# Patient Record
Sex: Female | Born: 1975 | Race: White | Hispanic: No | Marital: Married | Smoking: Never smoker
Health system: Southern US, Community
[De-identification: ages and names within clinical notes are randomized; demographics above are authoritative.]

## PROBLEM LIST (undated history)

## (undated) DIAGNOSIS — K200 Eosinophilic esophagitis: Secondary | ICD-10-CM

## (undated) DIAGNOSIS — K5792 Diverticulitis of intestine, part unspecified, without perforation or abscess without bleeding: Secondary | ICD-10-CM

## (undated) DIAGNOSIS — K259 Gastric ulcer, unspecified as acute or chronic, without hemorrhage or perforation: Secondary | ICD-10-CM

## (undated) DIAGNOSIS — E785 Hyperlipidemia, unspecified: Secondary | ICD-10-CM

## (undated) DIAGNOSIS — K219 Gastro-esophageal reflux disease without esophagitis: Secondary | ICD-10-CM

## (undated) DIAGNOSIS — B9689 Other specified bacterial agents as the cause of diseases classified elsewhere: Secondary | ICD-10-CM

## (undated) DIAGNOSIS — J208 Acute bronchitis due to other specified organisms: Secondary | ICD-10-CM

## (undated) DIAGNOSIS — R1310 Dysphagia, unspecified: Secondary | ICD-10-CM

## (undated) HISTORY — DX: Eosinophilic esophagitis: K20.0

## (undated) HISTORY — DX: Gastro-esophageal reflux disease without esophagitis: K21.9

## (undated) HISTORY — DX: Diverticulitis of intestine, part unspecified, without perforation or abscess without bleeding: K57.92

## (undated) HISTORY — DX: Dysphagia, unspecified: R13.10

## (undated) HISTORY — DX: Gastric ulcer, unspecified as acute or chronic, without hemorrhage or perforation: K25.9

## (undated) HISTORY — DX: Other specified bacterial agents as the cause of diseases classified elsewhere: B96.89

## (undated) HISTORY — PX: UPPER GASTROINTESTINAL ENDOSCOPY: SHX188

## (undated) HISTORY — PX: TONSILLECTOMY: SUR1361

## (undated) HISTORY — DX: Hyperlipidemia, unspecified: E78.5

## (undated) HISTORY — PX: CHOLECYSTECTOMY: SHX55

## (undated) HISTORY — DX: Other specified bacterial agents as the cause of diseases classified elsewhere: J20.8

---

## 2014-08-21 HISTORY — PX: COLONOSCOPY: SHX174

## 2014-10-23 HISTORY — PX: COLON RESECTION: SHX5231

## 2017-05-26 ENCOUNTER — Encounter: Payer: Self-pay | Admitting: Gastroenterology

## 2017-05-26 HISTORY — PX: ESOPHAGOGASTRODUODENOSCOPY: SHX1529

## 2017-06-16 ENCOUNTER — Encounter: Payer: Self-pay | Admitting: Gastroenterology

## 2017-06-24 ENCOUNTER — Encounter: Payer: Self-pay | Admitting: Allergy and Immunology

## 2017-06-24 ENCOUNTER — Ambulatory Visit: Payer: BC Managed Care – PPO | Admitting: Allergy and Immunology

## 2017-06-24 VITALS — BP 128/86 | HR 76 | Temp 98.6°F | Resp 20 | Ht 68.1 in | Wt 233.0 lb

## 2017-06-24 DIAGNOSIS — J3089 Other allergic rhinitis: Secondary | ICD-10-CM

## 2017-06-24 DIAGNOSIS — K2100 Gastro-esophageal reflux disease with esophagitis, without bleeding: Secondary | ICD-10-CM

## 2017-06-24 DIAGNOSIS — K200 Eosinophilic esophagitis: Secondary | ICD-10-CM | POA: Diagnosis not present

## 2017-06-24 MED ORDER — MONTELUKAST SODIUM 10 MG PO TABS: ORAL_TABLET | ORAL | 5 refills | Status: DC

## 2017-06-24 MED ORDER — FLUTICASONE PROPIONATE HFA 220 MCG/ACT IN AERO: INHALATION_SPRAY | RESPIRATORY_TRACT | 5 refills | Status: DC

## 2017-06-24 MED ORDER — METHYLPREDNISOLONE ACETATE 80 MG/ML IJ SUSP
80.0000 mg | Freq: Once | INTRAMUSCULAR | Status: AC
  Administered 2017-06-24: 80 mg via INTRAMUSCULAR

## 2017-06-24 NOTE — Patient Instructions (Addendum)
  1.  Allergen avoidance measures:   A.  Aeroallergens  B.  Four food elimination diet (FFED)?  C.  Dairy elimination diet?  2.  Treat and prevent inflammation:   A.  Flovent 220 -2 puffs and swallow twice a day  B.  Montelukast 10 mg -1 tablet 1 time per day  C.  OTC Nasacort -1 spray each nostril 1 time per day  D.  Depo-Medrol 80 IM delivered in clinic today  3.  Treat and prevent reflux:   A.  Slowly taper caffeine consumption as much as possible  B.  Continue Protonix to 40 mg twice a day  4.  If needed:   A.  OTC antihistamine -Claritin/Zyrtec/Allegra  5.  Return to clinic in 4 weeks or earlier if problem

## 2017-06-24 NOTE — Progress Notes (Signed)
Dear Dr. Chales Abrahams,  Thank you for referring Victoria Cruz to the [facility] [place] on 06/24/2017.   Below is a summation of this patient's evaluation and recommendations.  Thank you for your referral. I will keep you informed about this patient's response to treatment.   If you have any questions please do not hesitate to contact me.   Sincerely,  Jessica Priest, MD Allergy / Immunology [facility]   ______________________________________________________________________    NEW PATIENT NOTE  Referring Provider: Lynann Bologna, MD Primary Provider: Noni Saupe, MD Date of office visit: 06/24/2017    Subjective:   Chief Complaint:  Victoria Cruz (DOB: 03/22/1976) is a 42 y.o. female who presents to the clinic on 06/24/2017 with a chief complaint of Other (Eosinophilic Esophagitis) .     HPI: [name] presents to this clinic in evaluation of eosinophilic esophagitis.  [name] has had a history of swallowing obstruction for greater than 6 months for which she went to see Dr. Chales Abrahams and had an upper endoscopy 1 month age which documented eosinophilic esophagitis and placed her on swallowed Flovent and Protonix.  Over the course of the past month she has not really noticed any significant improvement with her symptoms  In addition, she has rather significant reflux with regurgitation up into her mouth on a common basis.  She also has lots of coughing and throat clearing and raspy voice and feeling as though something is caught in her throat.  This is still an active issue even though she has been using her treatment for a month.  She drinks 5 - 16 ounce Mountain Dew's a day and averages out to 1 tea per day and does not consume any alcohol or chocolate and does not smoke tobacco.  Monti also has nasal congestion and sneezing and itchy eyes.  This has been an issue that has arisen over the course of the past  6 months as well.  There is no obvious provoking factor giving rise to this issue.  Past Medical History:  Diagnosis Date  . Diverticulitis   . Eosinophilic esophagitis     Past Surgical History:  Procedure Laterality Date  . CHOLECYSTECTOMY    . COLON RESECTION  10/2014  . TONSILLECTOMY      Allergies as of 06/24/2017   No Known Allergies     Medication List      cyanocobalamin 1000 MCG/ML injection Commonly known as:  (VITAMIN B-12) Inject 1,000 mcg into the muscle every 30 (thirty) days.   FLOVENT HFA 110 MCG/ACT inhaler Generic drug:  fluticasone Two puffs and swallow twice daily as directed.   JUNEL FE 1/20 1-20 MG-MCG tablet Generic drug:  norethindrone-ethinyl estradiol Take 1 tablet by mouth daily.   pantoprazole 40 MG tablet Commonly known as:  PROTONIX Take 40 mg by mouth 2 (two) times daily.       Review of systems negative except as noted in HPI / PMHx or noted below:  Review of Systems  Constitutional: Negative.   HENT: Negative.   Eyes: Negative.   Respiratory: Negative.   Cardiovascular: Negative.   Gastrointestinal: Negative.   Genitourinary: Negative.   Musculoskeletal: Negative.   Skin: Negative.   Neurological: Negative.   Endo/Heme/Allergies: Negative.   Psychiatric/Behavioral: Negative.     Family History  Problem Relation Age of Onset  . Diabetes Mother   . High blood pressure Mother   . High  blood pressure Father   . Asthma Sister   . Breast cancer Maternal Aunt   . Cancer - Other Maternal Aunt        Oral cancer  . Kidney cancer Maternal Uncle   . Lung cancer Paternal Grandfather     Social History   Socioeconomic History  . Marital status: Married    Spouse name: Not on file  . Number of children: Not on file  . Years of education: Not on file  . Highest education level: Not on file  Occupational History  . Not on file  Social Needs  . Financial resource strain: Not on file  . Food insecurity:    Worry: Not on  file    Inability: Not on file  . Transportation needs:    Medical: Not on file    Non-medical: Not on file  Tobacco Use  . Smoking status: Never Smoker  . Smokeless tobacco: Never Used  Substance and Sexual Activity  . Alcohol use: Never    Frequency: Never  . Drug use: Never  . Sexual activity: Not on file  Lifestyle  . Physical activity:    Days per week: Not on file    Minutes per session: Not on file  . Stress: Not on file  Relationships  . Social connections:    Talks on phone: Not on file    Gets together: Not on file    Attends religious service: Not on file    Active member of club or organization: Not on file    Attends meetings of clubs or organizations: Not on file    Relationship status: Not on file  . Intimate partner violence:    Fear of current or ex partner: Not on file    Emotionally abused: Not on file    Physically abused: Not on file    Forced sexual activity: Not on file  Other Topics Concern  . Not on file  Social History Narrative  . 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  Objective:   Vitals:   06/24/17 1351  BP: 128/86  Pulse: 76  Resp: 20  Temp: 98.6 F (37 C)   Height: 5' 8.1" (173 cm) Weight: 233 lb (105.7 kg)  Physical Exam  Constitutional: She is well-developed, well-nourished, and in no distress.  Clearing, raspy voice  HENT:  Head: Normocephalic. Head is without right periorbital erythema and without left periorbital erythema.  Right Ear: Tympanic membrane, external ear and ear canal normal.  Left Ear: Tympanic membrane, external ear and ear canal normal.  Nose: Nose normal. No mucosal edema or rhinorrhea.  Mouth/Throat: Oropharynx is clear and moist and mucous membranes are normal. No oropharyngeal exudate.  Eyes: Pupils  are equal, round, and reactive to light. Conjunctivae and lids are normal.  Neck: Trachea normal. No tracheal deviation present. No thyromegaly present.  Cardiovascular: Normal rate, regular rhythm, S1 normal, S2 normal and normal heart sounds.  No murmur heard. Pulmonary/Chest: Effort normal. No stridor. No tachypnea. No respiratory distress. She has no wheezes. She has no rales. She exhibits no tenderness.  Abdominal: Soft. She exhibits no distension and no mass. There is no hepatosplenomegaly. There is no tenderness. There is no rebound and no guarding.  Musculoskeletal: She exhibits  no edema or tenderness.  Lymphadenopathy:       Head (right side): No tonsillar adenopathy present.       Head (left side): No tonsillar adenopathy present.    She has no cervical adenopathy.    She has no axillary adenopathy.  Neurological: She is alert. Gait normal.  Skin: No rash noted. She is not diaphoretic. No erythema. No pallor. Nails show no clubbing.  Psychiatric: Mood and affect normal.    Diagnostics: Allergy skin tests were performed.  She demonstrated hypersensitivity to grasses, weeds, trees, molds, and dust mite.  In addition she demonstrated hypersensitivity to peanut, sesame, almond, hazelnut, pes, navy bean, onion, cucumber, ginger, and mustard.  It should be noted that she has never had an allergic reaction when eating these foods.  Assessment and Plan:    1. Eosinophilic esophagitis   2. Other allergic rhinitis   3. Gastroesophageal reflux disease with esophagitis     1.  Allergen avoidance measures:   A.  Aeroallergens  B.  Four food elimination diet (FFED)?  C.  Dairy elimination diet?  2.  Treat and prevent inflammation:   A.  Flovent 220 -2 puffs and swallow twice a day  B.  Montelukast 10 mg -1 tablet 1 time per day  C.  OTC Nasacort -1 spray each nostril 1 time per day  D.  Depo-Medrol 80 IM delivered in clinic today  3.  Treat and prevent reflux:   A.  Slowly taper  caffeine consumption as much as possible  B.  Continue Protonix to 40 mg twice a day  4.  If needed:   A.  OTC antihistamine -Claritin/Zyrtec/Allegra  5.  Return to clinic in 4 weeks or earlier if problem  Vernona RiegerLaura has developed atopic disease over the course of the past 6 months involving her airway and conjunctiva and her esophagus.  We will get her to perform allergen avoidance measures as best as possible and I did talk to her today about a possible 4 food elimination diet or possible dairy elimination diet.  She drinks extensive amounts of caffeine which is probably contributing to her reflux and I had a talk with her today about dramatically consolidating that hobby.  I have increased her dose of swallowed steroids and gave her systemic steroid today as well as addressing the issue of upper airway inflammation with a leukotriene modifier and a nasal steroid.  I will see her back in this clinic in 4 weeks or earlier if there is a problem.  Jessica PriestEric J. Yisell Sprunger, MD Allergy / Immunology [facility] [place]

## 2017-06-25 ENCOUNTER — Encounter: Payer: Self-pay | Admitting: Allergy and Immunology

## 2017-07-23 ENCOUNTER — Ambulatory Visit: Payer: BC Managed Care – PPO | Admitting: Allergy and Immunology

## 2017-07-27 ENCOUNTER — Encounter: Payer: Self-pay | Admitting: Allergy and Immunology

## 2017-07-27 ENCOUNTER — Ambulatory Visit: Payer: BC Managed Care – PPO | Admitting: Allergy and Immunology

## 2017-07-27 VITALS — BP 118/80 | HR 88 | Resp 18

## 2017-07-27 DIAGNOSIS — K2100 Gastro-esophageal reflux disease with esophagitis, without bleeding: Secondary | ICD-10-CM

## 2017-07-27 DIAGNOSIS — J3089 Other allergic rhinitis: Secondary | ICD-10-CM | POA: Diagnosis not present

## 2017-07-27 DIAGNOSIS — K200 Eosinophilic esophagitis: Secondary | ICD-10-CM | POA: Diagnosis not present

## 2017-07-27 NOTE — Patient Instructions (Signed)
  1.  Continue Allergen avoidance measures   2.  Continue to Treat and prevent inflammation:   A.  Decrease Flovent 220 -2 puffs and swallow twice a day  B.  Montelukast 10 mg -1 tablet 1 time per day  C.  OTC Nasacort -1 spray each nostril 1 time per day  3.  Continue to Treat and prevent reflux:   A.  Protonix to 40 mg twice a day  4.  If needed:   A.  OTC antihistamine -Claritin/Zyrtec/Allegra  5.  Return to clinic in 12 weeks or earlier if problem

## 2017-07-27 NOTE — Progress Notes (Signed)
Follow-up Note  Referring Provider: Noni Saupe, MD Primary Provider: Noni Saupe, MD Date of Office Visit: 07/27/2017  Subjective:   Victoria Cruz (DOB: 1976/01/28) is a 41 y.o. female who [facility] on 07/27/2017 in re-evaluation of the following:  HPI: Victoria Cruz returns to this clinic in reevaluation of her eosinophilic esophagitis and allergic rhinitis.  Her last visit to this clinic was her initial evaluation of 24 June 2017.  She is really much better at this point in time regarding her eosinophilic esophagitis.  She rarely has any issues with regurgitation coming up into her throat and she has no swallowing this issues.  She has completely eliminated the use of all caffeine and she is almost dairy free and egg free.  She continues to use swallowed budesonide and Protonix.  She has had very little problems with sneezing and nasal congestion and itchy eyes.  She has been using Nasacort very rarely but does continue on montelukast consistently.  Allergies as of 07/27/2017   No Known Allergies     Medication List      cyanocobalamin 1000 MCG/ML injection Commonly known as:  (VITAMIN B-12) Inject 1,000 mcg into the muscle every 30 (thirty) days.   fluticasone 220 MCG/ACT inhaler Commonly known as:  FLOVENT HFA Two puffs and swallow twice daily as directed.   JUNEL FE 1/20 1-20 MG-MCG tablet Generic drug:  norethindrone-ethinyl estradiol Take 1 tablet by mouth daily.   montelukast 10 MG tablet Commonly known as:  SINGULAIR Take one tablet by mouth once daily as directed.   NASACORT ALLERGY 24HR 55 MCG/ACT Aero nasal inhaler Generic drug:  triamcinolone Place 1 spray into the nose daily.   pantoprazole 40 MG tablet Commonly known as:  PROTONIX Take 40 mg by mouth 2 (two) times daily.       Past Medical History:  Diagnosis Date  . Diverticulitis   . Eosinophilic esophagitis     Past Surgical History:  Procedure  Laterality Date  . CHOLECYSTECTOMY    . COLON RESECTION  10/2014  . TONSILLECTOMY      Review of systems negative except as noted in HPI / PMHx or noted below:  Review of Systems  Constitutional: Negative.   HENT: Negative.   Eyes: Negative.   Respiratory: Negative.   Cardiovascular: Negative.   Gastrointestinal: Negative.   Genitourinary: Negative.   Musculoskeletal: Negative.   Skin: Negative.   Neurological: Negative.   Endo/Heme/Allergies: Negative.   Psychiatric/Behavioral: Negative.      Objective:   Vitals:   07/27/17 1705  BP: 118/80  Pulse: 88  Resp: 18          Physical Exam  HENT:  Head: Normocephalic.  Right Ear: Tympanic membrane, external ear and ear canal normal.  Left Ear: Tympanic membrane, external ear and ear canal normal.  Nose: Nose normal. No mucosal edema or rhinorrhea.  Mouth/Throat: Uvula is midline, oropharynx is clear and moist and mucous membranes are normal. No oropharyngeal exudate.  Eyes: Conjunctivae are normal.  Neck: Trachea normal. No tracheal tenderness present. No tracheal deviation present. No thyromegaly present.  Cardiovascular: Normal rate, regular rhythm, S1 normal, S2 normal and normal heart sounds.  No murmur heard. Pulmonary/Chest: Breath sounds normal. No stridor. No respiratory distress. She has no wheezes. She has no rales.  Musculoskeletal: She exhibits no edema.  Lymphadenopathy:       Head (right side): No tonsillar adenopathy present.  Head (left side): No tonsillar adenopathy present.    She has no cervical adenopathy.  Neurological: She is alert.  Skin: No rash noted. She is not diaphoretic. No erythema. Nails show no clubbing.    Diagnostics: none  Assessment and Plan:   1. Eosinophilic esophagitis   2. Other allergic rhinitis   3. Gastroesophageal reflux disease with esophagitis     1.  Continue Allergen avoidance measures   2.  Continue to Treat and prevent inflammation:   A.  Decrease  Flovent 220 -2 puffs and swallow twice a day  B.  Montelukast 10 mg -1 tablet 1 time per day  C.  OTC Nasacort -1 spray each nostril 1 time per day  3.  Continue to Treat and prevent reflux:   A.  Protonix to 40 mg twice a day  4.  If needed:   A.  OTC antihistamine -Claritin/Zyrtec/Allegra  5.  Return to clinic in 12 weeks or earlier if problem  Rhya is really doing very well and we will see if we can decrease her dose of swallowed steroid at this point.  I will see her back in this clinic in 12 weeks and if she continues to do well we will attempt to further consolidate her treatment with that visit.  [name], MD Allergy / Immunology [facility]

## 2017-07-28 ENCOUNTER — Encounter: Payer: Self-pay | Admitting: Allergy and Immunology

## 2017-08-20 ENCOUNTER — Encounter: Payer: Self-pay | Admitting: Gastroenterology

## 2017-08-20 HISTORY — PX: LAPAROSCOPIC HYSTERECTOMY: SHX1926

## 2017-08-21 HISTORY — PX: APPENDECTOMY: SHX54

## 2017-08-27 ENCOUNTER — Ambulatory Visit (AMBULATORY_SURGERY_CENTER): Payer: Self-pay

## 2017-08-27 VITALS — Ht 67.0 in | Wt 225.6 lb

## 2017-08-27 DIAGNOSIS — K259 Gastric ulcer, unspecified as acute or chronic, without hemorrhage or perforation: Secondary | ICD-10-CM

## 2017-08-27 NOTE — Progress Notes (Signed)
Per pt, no allergies to soy or egg products.Pt not taking any weight loss meds or using  O2 at home.  Pt refused emmi video. 

## 2017-08-28 ENCOUNTER — Encounter: Payer: Self-pay | Admitting: Gastroenterology

## 2017-09-10 ENCOUNTER — Ambulatory Visit: Payer: BC Managed Care – PPO | Admitting: Gastroenterology

## 2017-09-10 ENCOUNTER — Other Ambulatory Visit: Payer: Self-pay

## 2017-09-10 ENCOUNTER — Encounter: Payer: Self-pay | Admitting: Gastroenterology

## 2017-09-10 VITALS — BP 116/70 | HR 76 | Temp 97.7°F | Resp 12 | Ht 68.0 in | Wt 233.0 lb

## 2017-09-10 DIAGNOSIS — K259 Gastric ulcer, unspecified as acute or chronic, without hemorrhage or perforation: Secondary | ICD-10-CM

## 2017-09-10 MED ORDER — SODIUM CHLORIDE 0.9 % IV SOLN
500.0000 mL | INTRAVENOUS | Status: DC
Start: 2017-09-10 — End: 2018-12-24

## 2017-09-10 NOTE — Progress Notes (Signed)
A/ox3 pleased with MAC, report to RN 

## 2017-09-10 NOTE — Progress Notes (Signed)
Called to room to assist during endoscopic procedure.  Patient ID and intended procedure confirmed with present staff. Received instructions for my participation in the procedure from the performing physician.  

## 2017-09-10 NOTE — Patient Instructions (Signed)
Impression/Recommendations:  Gastritis handout given to patient. Antireflux handout given to patient (GERD).  Soft diet today. Follow antireflux regimen.  Continue protonix 40 mg. By mouth twice a day, and Flovent 2 puffs daily.  Follow-up in GI clinic in 12 weeks.  Avoid non-steroids.  Tylenol only.  YOU HAD AN ENDOSCOPIC PROCEDURE TODAY AT THE [facility]:   Refer to the procedure report that was given to you for any specific questions about what was found during the examination.  If the procedure report does not answer your questions, please call your gastroenterologist to clarify.  If you requested that your care partner not be given the details of your procedure findings, then the procedure report has been included in a sealed envelope for you to review at your convenience later.  YOU SHOULD EXPECT: Some feelings of bloating in the abdomen. Passage of more gas than usual.  Walking can help get rid of the air that was put into your GI tract during the procedure and reduce the bloating. If you had a lower endoscopy (such as a colonoscopy or flexible sigmoidoscopy) you may notice spotting of blood in your stool or on the toilet paper. If you underwent a bowel prep for your procedure, you may not have a normal bowel movement for a few days.  Please Note:  You might notice some irritation and congestion in your nose or some drainage.  This is from the oxygen used during your procedure.  There is no need for concern and it should clear up in a day or so.  SYMPTOMS TO REPORT IMMEDIATELY:   Following upper endoscopy (EGD)  Vomiting of blood or coffee ground material  New chest pain or pain under the shoulder blades  Painful or persistently difficult swallowing  New shortness of breath  Fever of 100F or higher  Black, tarry-looking stools  For urgent or emergent issues, a gastroenterologist can be reached at any hour by calling [phone].   DIET:  We do recommend a  small meal at first, but then you may proceed to your regular diet.  Drink plenty of fluids but you should avoid alcoholic beverages for 24 hours.  ACTIVITY:  You should plan to take it easy for the rest of today and you should NOT DRIVE or use heavy machinery until tomorrow (because of the sedation medicines used during the test).    FOLLOW UP: Our staff will call the number listed on your records the next business day following your procedure to check on you and address any questions or concerns that you may have regarding the information given to you following your procedure. If we do not reach you, we will leave a message.  However, if you are feeling well and you are not experiencing any problems, there is no need to return our call.  We will assume that you have returned to your regular daily activities without incident.  If any biopsies were taken you will be contacted by phone or by letter within the next 1-3 weeks.  Please call us at [phone] if you have not heard about the biopsies in 3 weeks.    SIGNATURES/CONFIDENTIALITY: You and/or your care partner have signed paperwork which will be entered into your electronic medical record.  These signatures attest to the fact that that the information above on your After Visit Summary has been reviewed and is understood.  Full responsibility of the confidentiality of this discharge information lies with you and/or your care-partner.

## 2017-09-10 NOTE — Op Note (Signed)
[facility] Patient Name: Victoria Cruz Procedure Date: 09/10/2017 9:19 AM MRN: [id number] Endoscopist: Jackquline Denmark , MD Age: 42 Referring MD:  Date of Birth: February 06, 1976 Gender: Female Account #: [id number] Procedure:                Upper GI endoscopy Indications:              History of gastric ulcers. Pt with established                            eosinophilic esophagitis on treatment with PPIs and                            underwent, has been having intermittent dysphagia. Medicines:                Monitored Anesthesia Care Procedure:                Pre-Anesthesia Assessment:                           - Prior to the procedure, a History and Physical                            was performed, and patient medications and                            allergies were reviewed. The patient is competent.                            The risks and benefits of the procedure and the                            sedation options and risks were discussed with the                            patient. All questions were answered and informed                            consent was obtained. Patient identification and                            proposed procedure were verified by the physician                            in the procedure room. Mental Status Examination:                            alert and oriented. Prophylactic Antibiotics: The                            patient does not require prophylactic antibiotics.                            Prior Anticoagulants: The patient has taken no  previous anticoagulant or antiplatelet agents. ASA                            Grade Assessment: I - A normal, healthy patient.                            After reviewing the risks and benefits, the patient                            was deemed in satisfactory condition to undergo the                            procedure. The anesthesia plan was to use monitored          anesthesia care (MAC). Immediately prior to                            administration of medications, the patient was                            re-assessed for adequacy to receive sedatives. The                            heart rate, respiratory rate, oxygen saturations,                            blood pressure, adequacy of pulmonary ventilation,                            and response to care were monitored throughout the                            procedure. The physical status of the patient was                            re-assessed after the procedure.                           After obtaining informed consent, the endoscope was                            passed under direct vision. Throughout the                            procedure, the patient's blood pressure, pulse, and                            oxygen saturations were monitored continuously. The                            Endoscope was introduced through the mouth, and                            advanced to the second part of duodenum. The upper  GI endoscopy was accomplished without difficulty.                            The patient tolerated the procedure well. Scope In: Scope Out: Findings:                 Mucosal changes including feline appearance and                            longitudinal furrows were found in the proximal                            esophagus. Esophageal findings were graded using                            the Eosinophilic Esophagitis Endoscopic Reference                            Score (EoE-EREFS) as: Edema Grade 0 Normal                            (distinct vascular markings), Rings Grade 1 Mild                            (subtle circumferential ridges seen on esophageal                            distension), Exudates Grade 0 None (no white                            lesions seen), Furrows Grade 1 Present (vertical                            lines with or without  visible depth) and Stricture                            none (no stricture found). Biopsies were obtained                            from the proximal and distal esophagus with cold                            forceps for histology of suspected eosinophilic                            esophagitis. Verification of patient identification                            for the specimen was done. Estimated blood loss was                            minimal. The scope was withdrawn. Dilation was  performed with a Maloney dilator with no resistance                            at 48 Fr.                           A few localized, 4 mm non-bleeding erosions were                            found in the gastric antrum. There were no stigmata                            of recent bleeding. Biopsies were taken with a cold                            forceps for histology.                           The duodenum was normal. Complications:            No immediate complications. Estimated Blood Loss:     Estimated blood loss: none. Impression:               - Esophageal mucosal changes secondary to                            eosinophilic esophagitis. Biopsied. Dilated.                           - Mild Erosive gastritis. No ulcers. Biopsied. Recommendation:           - Patient has a contact number available for                            emergencies. The signs and symptoms of potential                            delayed complications were discussed with the                            patient. Return to normal activities tomorrow.                            Written discharge instructions were provided to the                            patient.                           - Soft diet today.                           - Follow an antireflux regimen.                           - Continue protonix 40 mg by mouth twice a day and  Flovent 2 puffs daily (swallow)                            - Follow-up in the GI clinic in 12 weeks.                           - avoid nonsteroidals. Jackquline Denmark, MD 09/10/2017 9:42:16 AM This report has been signed electronically.

## 2017-09-10 NOTE — Progress Notes (Signed)
No changes in medical or surgical hx since PV per pt 

## 2017-09-11 ENCOUNTER — Telehealth: Payer: Self-pay | Admitting: *Deleted

## 2017-09-11 NOTE — Telephone Encounter (Signed)
Unable to reach pt. Or leave message.  All numbers busy.

## 2017-09-11 NOTE — Telephone Encounter (Signed)
Unable to reach pt. Second call.

## 2017-09-15 ENCOUNTER — Encounter: Payer: Self-pay | Admitting: Gastroenterology

## 2017-10-19 ENCOUNTER — Ambulatory Visit: Payer: BC Managed Care – PPO | Admitting: Allergy and Immunology

## 2018-01-21 HISTORY — PX: COLONOSCOPY: SHX174

## 2018-03-03 ENCOUNTER — Encounter: Payer: Self-pay | Admitting: Gastroenterology

## 2018-03-04 ENCOUNTER — Ambulatory Visit: Payer: BC Managed Care – PPO | Admitting: Gastroenterology

## 2018-03-04 ENCOUNTER — Encounter: Payer: Self-pay | Admitting: Gastroenterology

## 2018-03-04 ENCOUNTER — Encounter (INDEPENDENT_AMBULATORY_CARE_PROVIDER_SITE_OTHER): Payer: Self-pay

## 2018-03-04 VITALS — BP 110/80 | HR 75 | Ht 67.0 in | Wt 193.0 lb

## 2018-03-04 DIAGNOSIS — B370 Candidal stomatitis: Secondary | ICD-10-CM

## 2018-03-04 DIAGNOSIS — K200 Eosinophilic esophagitis: Secondary | ICD-10-CM | POA: Diagnosis not present

## 2018-03-04 MED ORDER — FLUCONAZOLE 100 MG PO TABS: 100.0000 mg | ORAL_TABLET | ORAL | 0 refills | Status: DC

## 2018-03-04 NOTE — Patient Instructions (Addendum)
If you are age 44 or older, your body mass index should be between 23-30. Your Body mass index is 30.23 kg/m. If this is out of the aforementioned range listed, please consider follow up with your Primary Care Provider.  If you are age 44 or younger, your body mass index should be between 19-25. Your Body mass index is 30.23 kg/m. If this is out of the aformentioned range listed, please consider follow up with your Primary Care Provider.    We have sent the following medications to your pharmacy for you to pick up at your convenience: Diflucan 200mg  first dose and then 100mg  daily x 5 days.  Thank you,  Dr. Lynann Bolognaajesh Gupta

## 2018-03-04 NOTE — Progress Notes (Signed)
Chief Complaint:   Referring Provider:  Noni Saupe, MD      ASSESSMENT AND PLAN;   #1. EoE with esophageal stricture s/p dilatation 48Fr 05/2017, multiple gastric ulcers. Rpt EGD 08/2017 showed healing of ulcers but still with EoE without stricture. Bx: Squamous mucosa with increased eosinophils (>500/HPF). Seen by Dr Lucie Leather-  Multiple allergies. Has not been totally compliant with medications and food allergen avoidance.  #2. Thrush  Plan: - Diflucan 200mg  po qd, 100mg  po qd x 5 days. - Continue Protonix 40 mg p.o. twice daily. - She has increased Flovent 222 puffs p.o. twice daily, swallow, rinse mouth and throat thereafter. - If still with problems, proceed with barium swallow, check labs (CBC with diff, CMP, CRP) and will consider p.o. steroids.  Of note that her biopsies revealed more than 500 eosinophils per hpf.  I have also stressed importance of compliance with medications. - Strict avoidance of foods suggested by Dr. Lucie Leather.   HPI:    Victoria Cruz is a 42 y.o. female  Has been having more problems over the last 2 to 3 weeks with intermittent dysphagia and painful swallowing. Has been getting hoarse Denies any food impaction but has come very close. Has some sore throat Has again started taking Protonix 40 mg p.o. twice daily and has increased Flovent to twice a day. Does feel somewhat better. Accompanied by her daughter who has recently transferred to [employer]  Recent colonoscopy by Dr. Georgiana Shore on 01/21/2018- neg. recommend repeating at the age 64. (report scanned).  Patient with history of diverticulitis status post sigmoid resection 10/2014 Past Medical History:  Diagnosis Date  . Diverticulitis   . Dysphagia   . Eosinophilic esophagitis   . GERD (gastroesophageal reflux disease)   . Hyperlipidemia    no meds  . Multiple gastric ulcers     Past Surgical History:  Procedure Laterality Date  . CHOLECYSTECTOMY    . COLON RESECTION  10/2014    . COLONOSCOPY  08/21/2014   Colonic poylp status post polypectomy. Mild left colonic diverticulosis.   . COLONOSCOPY  01/21/2018   Dr Georgiana Shore in [place]  . LAPAROSCOPIC HYSTERECTOMY  08/20/2017   left ovaries   . TONSILLECTOMY      Family History  Problem Relation Age of Onset  . Diabetes Mother   . High blood pressure Mother   . High blood pressure Father   . Asthma Sister   . Breast cancer Maternal Aunt   . Cancer - Other Maternal Aunt        Oral cancer  . Kidney cancer Maternal Uncle   . Lung cancer Paternal Grandfather   . Colon cancer Neg Hx   . Esophageal cancer Neg Hx   . Stomach cancer Neg Hx   . Rectal cancer Neg Hx     Social History   Tobacco Use  . Smoking status: Never Smoker  . Smokeless tobacco: Never Used  Substance Use Topics  . Alcohol use: Not Currently    Frequency: Never  . Drug use: Never    Current Outpatient Medications  Medication Sig Dispense Refill  . cyanocobalamin (,VITAMIN B-12,) 1000 MCG/ML injection Inject 1,000 mcg into the muscle every 30 (thirty) days.     Marland Kitchen docusate sodium (COLACE) 100 MG capsule Take 100 mg by mouth as needed for mild constipation.    . fluticasone (FLOVENT HFA) 220 MCG/ACT inhaler Two puffs and swallow twice daily as directed. 1 Inhaler 5  . montelukast (SINGULAIR)  10 MG tablet Take one tablet by mouth once daily as directed. 30 tablet 5  . pantoprazole (PROTONIX) 40 MG tablet Take 40 mg by mouth 2 (two) times daily.     Marland Kitchen. senna (SENOKOT) 8.6 MG tablet Take 1 tablet by mouth as needed for constipation.    . triamcinolone (NASACORT ALLERGY 24HR) 55 MCG/ACT AERO nasal inhaler Place 1 spray into the nose daily.     Current Facility-Administered Medications  Medication Dose Route Frequency Provider Last Rate Last Dose  . 0.9 %  sodium chloride infusion  500 mL Intravenous Continuous Lynann BolognaGupta, Shanele Nissan, MD        No Known Allergies  Review of Systems:     Physical Exam:    BP 110/80   Pulse 75   Ht 5\' 7"   (1.702 m)   Wt 193 lb (87.5 kg)   BMI 30.23 kg/m  Filed Weights   03/04/18 1501  Weight: 193 lb (87.5 kg)   Constitutional:  Well-developed, in no acute distress. Psychiatric: Normal mood and affect. Behavior is normal. HEENT: Pupils normal.  Conjunctivae are normal. No scleral icterus. Mild thrush Cardiovascular: Normal rate, regular rhythm. No edema Abdominal: Soft, nondistended. Nontender. Bowel sounds active throughout. There are no masses palpable. No hepatomegaly. Neurological: Alert and oriented to person place and time. Skin: Skin is warm and dry. No rashes noted. 25 minutes spent with the patient today. Greater than 50% was spent in counseling and coordination of care with the patient   Edman Circleaj Carrington Mullenax, MD 03/04/2018, 3:38 PM  Cc: Noni Saupeedding, John F. II, MD

## 2018-03-08 ENCOUNTER — Ambulatory Visit: Payer: BC Managed Care – PPO | Admitting: Gastroenterology

## 2018-03-10 ENCOUNTER — Telehealth: Payer: Self-pay | Admitting: Gastroenterology

## 2018-03-10 NOTE — Telephone Encounter (Signed)
proceed with barium swallow with Ba tab as well check labs (CBC with diff, CMP, CRP) and Start prednisone 20mg  po qd p.o. x 2 weeks, then 10mg  po qd x 2 weeks. Can stop flovent, day 2 after starting prednisone. Call in 2 weeks

## 2018-03-10 NOTE — Telephone Encounter (Signed)
Pt called in and stated that she was still having problems swallowing and throat still hurting after prescribing fluconazole (DIFLUCAN) 100 MG tablet Pt states if you cant call back today please reach out on her work phone [phone] and let them know you are trying to contact nurse Moshe CiproBritt

## 2018-03-10 NOTE — Telephone Encounter (Signed)
Patient has not been contacted-however, your OV note from 03/04/18 states: Plan: - Diflucan 200mg  po qd, 100mg  po qd x 5 days. - Continue Protonix 40 mg p.o. twice daily. - She has increased Flovent 222 puffs p.o. twice daily, swallow, rinse mouth and throat thereafter. - If still with problems, proceed with barium swallow, check labs (CBC with diff, CMP, CRP) and will consider p.o. steroids.  Of note that her biopsies revealed more than 500 eosinophils per hpf.  I have also stressed importance of compliance with medications. - Strict avoidance of foods suggested by Dr. Lucie LeatherKozlow.   Would you like for the patient to try an alternative medication or to proceed with barium swallow? Please advise

## 2018-03-11 ENCOUNTER — Other Ambulatory Visit: Payer: Self-pay

## 2018-03-11 DIAGNOSIS — B370 Candidal stomatitis: Secondary | ICD-10-CM

## 2018-03-11 DIAGNOSIS — K200 Eosinophilic esophagitis: Secondary | ICD-10-CM

## 2018-03-11 MED ORDER — PREDNISONE 20 MG PO TABS: 20.0000 mg | ORAL_TABLET | Freq: Every day | ORAL | 0 refills | Status: DC

## 2018-03-11 NOTE — Progress Notes (Signed)
Orders for CBC, CMP, CRP, Prednisone, and barium swallow have been entered into Epic;

## 2018-03-11 NOTE — Telephone Encounter (Signed)
Left message for patient to call back; Order in Epic for lab work , barium swallow, and Prednisone; Pt needs to call in 2 weeks

## 2018-03-12 NOTE — Telephone Encounter (Signed)
Left message for patient to call back  

## 2018-03-15 NOTE — Telephone Encounter (Signed)
Left message for patient to call back  

## 2018-03-16 NOTE — Telephone Encounter (Signed)
Called and spoke with patient- patient informed of MD recommendations and is agreeable with plan of care; patient is requesting the barium swallow be scheduled prior to the end of year 02/23/18; scheduling department is closed as of today; will try again on next business day to try and schedule the barium swallow for the patient; patient is aware that scan may not be able to be scheduled prior to 03/23/18; patient advised to call back if questions/concerns arise; patient verbalized understanding of information/instructions;

## 2018-03-18 ENCOUNTER — Other Ambulatory Visit: Payer: BC Managed Care – PPO

## 2018-03-18 DIAGNOSIS — K200 Eosinophilic esophagitis: Secondary | ICD-10-CM

## 2018-03-18 DIAGNOSIS — B370 Candidal stomatitis: Secondary | ICD-10-CM

## 2018-03-18 LAB — CBC WITH DIFFERENTIAL/PLATELET
Basophils Absolute: 0 10*3/uL (ref 0.0–0.1)
Basophils Relative: 0.5 % (ref 0.0–3.0)
Eosinophils Absolute: 0.1 10*3/uL (ref 0.0–0.7)
Eosinophils Relative: 1.4 % (ref 0.0–5.0)
HCT: 39.8 % (ref 36.0–46.0)
Hemoglobin: 13.7 g/dL (ref 12.0–15.0)
LYMPHS PCT: 33.9 % (ref 12.0–46.0)
Lymphs Abs: 2.8 10*3/uL (ref 0.7–4.0)
MCHC: 34.4 g/dL (ref 30.0–36.0)
MCV: 88.9 fl (ref 78.0–100.0)
Monocytes Absolute: 0.5 10*3/uL (ref 0.1–1.0)
Monocytes Relative: 6.3 % (ref 3.0–12.0)
NEUTROS PCT: 57.9 % (ref 43.0–77.0)
Neutro Abs: 4.8 10*3/uL (ref 1.4–7.7)
Platelets: 321 10*3/uL (ref 150.0–400.0)
RBC: 4.48 Mil/uL (ref 3.87–5.11)
RDW: 12.8 % (ref 11.5–15.5)
WBC: 8.3 10*3/uL (ref 4.0–10.5)

## 2018-03-18 LAB — COMPREHENSIVE METABOLIC PANEL
ALT: 16 U/L (ref 0–35)
AST: 11 U/L (ref 0–37)
Albumin: 4.3 g/dL (ref 3.5–5.2)
Alkaline Phosphatase: 59 U/L (ref 39–117)
BUN: 10 mg/dL (ref 6–23)
CALCIUM: 8.7 mg/dL (ref 8.4–10.5)
CO2: 27 meq/L (ref 19–32)
Chloride: 105 mEq/L (ref 96–112)
Creatinine, Ser: 0.6 mg/dL (ref 0.40–1.20)
GFR: 114.92 mL/min (ref >60.00)
Glucose, Bld: 103 mg/dL — ABNORMAL HIGH (ref 70–99)
Potassium: 3.6 mEq/L (ref 3.5–5.1)
Sodium: 141 mEq/L (ref 135–145)
Total Bilirubin: 0.5 mg/dL (ref 0.2–1.2)
Total Protein: 6.8 g/dL (ref 6.0–8.3)

## 2018-03-18 LAB — C-REACTIVE PROTEIN: CRP: 0.2 mg/dL — ABNORMAL LOW (ref 0.5–20.0)

## 2018-03-18 NOTE — Telephone Encounter (Signed)
Called and spoke with patient- patient made aware of barium swallow being scheduled on Monday 03/22/18-patient to arrive at 9:45 am at registration 1st floor for radiology for a 10:00 am appt; patient made aware she needs to be NPO after 7:00 am; Patient verbalized understanding of information/instructions; Patient was advised to call back if questions/concerns arise;

## 2018-03-22 ENCOUNTER — Ambulatory Visit (HOSPITAL_COMMUNITY)
Admission: RE | Admit: 2018-03-22 | Discharge: 2018-03-22 | Disposition: A | Discharge status: Home / Self Care | Payer: BC Managed Care – PPO | Source: Ambulatory Visit | Attending: Gastroenterology | Admitting: Gastroenterology

## 2018-03-22 DIAGNOSIS — B370 Candidal stomatitis: Secondary | ICD-10-CM | POA: Insufficient documentation

## 2018-03-22 DIAGNOSIS — K200 Eosinophilic esophagitis: Secondary | ICD-10-CM | POA: Diagnosis present

## 2018-04-02 ENCOUNTER — Telehealth: Payer: Self-pay | Admitting: Gastroenterology

## 2018-04-05 NOTE — Telephone Encounter (Signed)
Attempted to reach patient-unable to leave a message as mailbox is full;  See previous message as patient was left a VM to return a call to the office if needs/questions arise;

## 2018-08-17 ENCOUNTER — Other Ambulatory Visit: Payer: Self-pay

## 2018-08-17 ENCOUNTER — Telehealth: Payer: BC Managed Care – PPO | Admitting: Gastroenterology

## 2018-11-17 ENCOUNTER — Ambulatory Visit: Payer: BC Managed Care – PPO | Admitting: Gastroenterology

## 2018-11-17 ENCOUNTER — Telehealth (INDEPENDENT_AMBULATORY_CARE_PROVIDER_SITE_OTHER): Payer: BC Managed Care – PPO | Admitting: Gastroenterology

## 2018-11-17 ENCOUNTER — Encounter: Payer: Self-pay | Admitting: Gastroenterology

## 2018-11-17 ENCOUNTER — Other Ambulatory Visit: Payer: Self-pay

## 2018-11-17 VITALS — Ht 67.0 in | Wt 220.0 lb

## 2018-11-17 DIAGNOSIS — K200 Eosinophilic esophagitis: Secondary | ICD-10-CM

## 2018-11-17 DIAGNOSIS — K581 Irritable bowel syndrome with constipation: Secondary | ICD-10-CM

## 2018-11-17 DIAGNOSIS — R1011 Right upper quadrant pain: Secondary | ICD-10-CM

## 2018-11-17 MED ORDER — AMBULATORY NON FORMULARY MEDICATION: 5.0000 mL | Freq: Four times a day (QID) | 1 refills | Status: DC

## 2018-11-17 MED ORDER — DEXILANT 60 MG PO CPDR: 60.0000 mg | DELAYED_RELEASE_CAPSULE | Freq: Every day | ORAL | 3 refills | Status: DC

## 2018-11-17 NOTE — Patient Instructions (Addendum)
If you are age 45 or older, your body mass index should be between 23-30. Your Body mass index is 34.46 kg/m. If this is out of the aforementioned range listed, please consider follow up with your Primary Care Provider.  If you are age 45 or younger, your body mass index should be between 19-25. Your Body mass index is 34.46 kg/m. If this is out of the aformentioned range listed, please consider follow up with your Primary Care Provider.   To help prevent the possible spread of infection to our patients, communities, and staff; we will be implementing the following measures:  As of now we are not allowing any visitors/family members to accompany you to any upcoming appointments with [facility]. If you have any concerns about this please contact our office to discuss prior to the appointment.   Your provider has requested that you go to the basement level for lab work at our [place] location ([address] [place] [address]) . Press "B" on the elevator. The lab is located at the first door on the left as you exit the elevator. You may go at whatever time is convienent for you. The current hours of operations are Monday- Friday 7:30am-4:30pm.  Please purchase the following medications over the counter and take as directed: Start taking your Ducolax with 2 equal parts Maalox today. Colace 100mg  1 tablet by mouth daily.  We have sent the following medications to your pharmacy for you to pick up at your convenience: Dexilant 60mg  by mouth daily.  We have sent your GI Cocktail to a speciality pharmacy called [facility]  [phone]([address] [place] [address])  You have been scheduled for a Barium Esophogram at [facility] (1st floor of the hospital) on 11/23/2018 at 11:00am. Please arrive 15 minutes prior to your appointment for registration. Make certain not to have anything to eat or drink 3 hours prior to your test. If you need to reschedule  for any reason, please contact radiology at [phone] to do so. __________________________________________________________________ A barium swallow is an examination that concentrates on views of the esophagus. This tends to be a double contrast exam (barium and two liquids which, when combined, create a gas to distend the wall of the oesophagus) or single contrast (non-ionic iodine based). The study is usually tailored to your symptoms so a good history is essential. Attention is paid during the study to the form, structure and configuration of the esophagus, looking for functional disorders (such as aspiration, dysphagia, achalasia, motility and reflux) EXAMINATION You may be asked to change into a gown, depending on the type of swallow being performed. A radiologist and radiographer will perform the procedure. The radiologist will advise you of the type of contrast selected for your procedure and direct you during the exam. You will be asked to stand, sit or lie in several different positions and to hold a small amount of fluid in your mouth before being asked to swallow while the imaging is performed .In some instances you may be asked to swallow barium coated marshmallows to assess the motility of a solid food bolus. The exam can be recorded as a digital or video fluoroscopy procedure. POST PROCEDURE It will take 1-2 days for the barium to pass through your system. To facilitate this, it is important, unless otherwise directed, to increase your fluids for the next 24-48hrs and to resume your normal diet.  This test typically takes about 30 minutes to perform. __________________________________________________________________________________  Please call Dr. Leland Her nurse Faythe Casa, RN) in 2 weeks at [phone]  to let her now how you are doing.   Thank you,  Dr. Jackquline Denmark

## 2018-11-17 NOTE — Progress Notes (Signed)
Chief Complaint:   Referring Provider:  Noni Saupeedding, John F. II, MD      ASSESSMENT AND PLAN;   #1. EoE with eso stricture s/p dil 48Fr 05/2017, multiple GUs. Rpt EGD 08/2017 showed healing of ulcers but still with EoE without stricture. Bx: Squamous mucosa with increased eosinophils (>500/HPF). Seen by Dr Lucie LeatherKozlow-  Multiple allergies. Has not been totally compliant with medications and food allergen avoidance. Now with occ dysphagia. #2. Intermittent RUQ pain. H/O s/p lap chole  #3. IBS with constipation.  Plan: - Change Protonix to dexilant 60mg  po qd. - Ba swallow with Ba tablet. If Abn, may require rpt dilatation. - Flovent 1 inhalation twice daily x 2 weeks, then 1 once a day for 2 more weeks.  Rinse mouth thoroughly afterwards.  Please swallow. - Check CBC with diff, CMP, CRP, lipase. - GI coctail 5-3410ml Q6hrs. #120 ml, 1 refill.(equal amounts of Maalox, viscous lidocaine and liquid Donnatal or Bentyl) - Start dulcolax 2 equal amounts of Maalox, tonight. Take 1 colace/day. - Call in 2 weeks. - If still with problems and the above WU is negative, proceed with CT scan abdo/pelvis. - FU in 12 weeks.    HPI:    Victoria Cruz is a 43 y.o. female  For follow-up visit. Got choked on [place] fries yesterday.  Did not have to go to the emergency room. Still having heartburn despite Protonix twice daily. Has been using Flovent as well.  Also with intermittent right upper quadrant abdominal pain and right lower quadrant abdominal pain with associated constipation.  Does get relieved when she has good bowel movements.  Took antibiotics recently then again had oral thrush.  She was given a course of Diflucan.  Denies having any further thrush.  No odynophagia.  More constipated.  Has not been drinking enough water.  No melena or hematochezia.  No weight loss.  Recent colonoscopy by Dr. Georgiana ShoreLininger on 01/21/2018- neg. recommend repeating at the age 89. (report scanned).  Patient with history  of diverticulitis status post sigmoid resection 10/2014 Past Medical History:  Diagnosis Date  . Acute bacterial bronchitis   . Diverticulitis   . Dysphagia   . Eosinophilic esophagitis   . GERD (gastroesophageal reflux disease)   . Hyperlipidemia    no meds  . Multiple gastric ulcers     Past Surgical History:  Procedure Laterality Date  . APPENDECTOMY  08/21/2017  . CHOLECYSTECTOMY    . COLON RESECTION  10/2014  . COLONOSCOPY  08/21/2014   Colonic poylp status post polypectomy. Mild left colonic diverticulosis.   . COLONOSCOPY  01/21/2018   [place]ininger in [place]  . ESOPHAGOGASTRODUODENOSCOPY  05/26/2017   Linear and circular furrows of the esophagus highly suggestive of eosinophilic esophagitis (biopsied). Mild esophageal stricture status post esophageal dilatation. Multiple gastric ulcers (biopsied)  . LAPAROSCOPIC HYSTERECTOMY  08/20/2017   left ovaries   . TONSILLECTOMY      Family History  Problem Relation Age of Onset  . Diabetes Mother   . High blood pressure Mother   . High blood pressure Father   . Asthma Sister   . Breast cancer Maternal Aunt   . Cancer - Other Maternal Aunt        Oral cancer  . Kidney cancer Maternal Uncle   . Lung cancer Paternal Grandfather   . Colon cancer Neg Hx   . Esophageal cancer Neg Hx   . Stomach cancer Neg Hx   . Rectal cancer Neg Hx  Social History   Tobacco Use  . Smoking status: Never Smoker  . Smokeless tobacco: Never Used  Substance Use Topics  . Alcohol use: Not Currently    Frequency: Never  . Drug use: Never    Current Outpatient Medications  Medication Sig Dispense Refill  . benzonatate (TESSALON) 200 MG capsule as needed.    . cyanocobalamin (,VITAMIN B-12,) 1000 MCG/ML injection Inject 1,000 mcg into the muscle every 30 (thirty) days.     Marland Kitchen docusate sodium (COLACE) 100 MG capsule Take 100 mg by mouth as needed for mild constipation.    Marland Kitchen levofloxacin (LEVAQUIN) 750 MG tablet 1 tablet daily.    .  montelukast (SINGULAIR) 10 MG tablet Take one tablet by mouth once daily as directed. 30 tablet 5  . nystatin (MYCOSTATIN) 100000 UNIT/ML suspension 2 (two) times daily.    . ondansetron (ZOFRAN) 8 MG tablet as needed.    . pantoprazole (PROTONIX) 40 MG tablet Take 40 mg by mouth 2 (two) times daily.     Marland Kitchen senna (SENOKOT) 8.6 MG tablet Take 1 tablet by mouth as needed for constipation.    . triamcinolone (NASACORT ALLERGY 24HR) 55 MCG/ACT AERO nasal inhaler Place 1 spray into the nose daily.    . fluticasone (FLOVENT HFA) 220 MCG/ACT inhaler Two puffs and swallow twice daily as directed. (Patient not taking: Reported on 11/17/2018) 1 Inhaler 5   Current Facility-Administered Medications  Medication Dose Route Frequency Provider Last Rate Last Dose  . 0.9 %  sodium chloride infusion  500 mL Intravenous Continuous Jackquline Denmark, MD        Allergies  Allergen Reactions  . Madelaine Bhat Isothiocyanate]   . Onion   . Peanut-Containing Drug Products     And possibly all other nuts    Review of Systems:     Physical Exam:    Ht 5\' 7"  (1.702 m)   Wt 220 lb (99.8 kg)   BMI 34.46 kg/m  Filed Weights   11/17/18 1252  Weight: 220 lb (99.8 kg)   This service was provided via telemedicine.  The patient was located at home.  The provider was located in office.  The patient did consent to this telephone visit and is aware of possible charges through their insurance for this visit.   Time spent on call/coordination of care: 25 min    Carmell Austria, MD 11/17/2018, 2:22 PM  Cc: Angelina Sheriff, MD

## 2018-11-22 ENCOUNTER — Other Ambulatory Visit (INDEPENDENT_AMBULATORY_CARE_PROVIDER_SITE_OTHER): Payer: BC Managed Care – PPO

## 2018-11-22 DIAGNOSIS — R1011 Right upper quadrant pain: Secondary | ICD-10-CM | POA: Diagnosis not present

## 2018-11-22 DIAGNOSIS — K581 Irritable bowel syndrome with constipation: Secondary | ICD-10-CM

## 2018-11-22 DIAGNOSIS — K200 Eosinophilic esophagitis: Secondary | ICD-10-CM

## 2018-11-22 LAB — COMPREHENSIVE METABOLIC PANEL
ALT: 16 U/L (ref 0–35)
AST: 14 U/L (ref 0–37)
Albumin: 4.1 g/dL (ref 3.5–5.2)
Alkaline Phosphatase: 58 U/L (ref 39–117)
BUN: 8 mg/dL (ref 6–23)
CO2: 30 mEq/L (ref 19–32)
Calcium: 8.9 mg/dL (ref 8.4–10.5)
Chloride: 102 mEq/L (ref 96–112)
Creatinine, Ser: 0.56 mg/dL (ref 0.40–1.20)
GFR: 116.73 mL/min (ref >60.00)
Glucose, Bld: 103 mg/dL — ABNORMAL HIGH (ref 70–99)
Potassium: 3.8 mEq/L (ref 3.5–5.1)
Sodium: 140 mEq/L (ref 135–145)
Total Bilirubin: 1.1 mg/dL (ref 0.2–1.2)
Total Protein: 6.6 g/dL (ref 6.0–8.3)

## 2018-11-22 LAB — CBC WITH DIFFERENTIAL/PLATELET
Basophils Absolute: 0 10*3/uL (ref 0.0–0.1)
Basophils Relative: 0.6 % (ref 0.0–3.0)
Eosinophils Absolute: 0.4 10*3/uL (ref 0.0–0.7)
Eosinophils Relative: 4.8 % (ref 0.0–5.0)
HCT: 38.2 % (ref 36.0–46.0)
Hemoglobin: 13.2 g/dL (ref 12.0–15.0)
Lymphocytes Relative: 26.8 % (ref 12.0–46.0)
Lymphs Abs: 2.1 10*3/uL (ref 0.7–4.0)
MCHC: 34.5 g/dL (ref 30.0–36.0)
MCV: 88 fl (ref 78.0–100.0)
Monocytes Absolute: 0.6 10*3/uL (ref 0.1–1.0)
Monocytes Relative: 7.5 % (ref 3.0–12.0)
Neutro Abs: 4.6 10*3/uL (ref 1.4–7.7)
Neutrophils Relative %: 60.3 % (ref 43.0–77.0)
Platelets: 328 10*3/uL (ref 150.0–400.0)
RBC: 4.34 Mil/uL (ref 3.87–5.11)
RDW: 12.4 % (ref 11.5–15.5)
WBC: 7.7 10*3/uL (ref 4.0–10.5)

## 2018-11-22 LAB — C-REACTIVE PROTEIN: CRP: <1 mg/dL (ref 0.5–20.0)

## 2018-11-22 LAB — LIPASE: Lipase: 7 U/L — ABNORMAL LOW (ref 11.0–59.0)

## 2018-11-23 ENCOUNTER — Other Ambulatory Visit: Payer: Self-pay

## 2018-11-23 ENCOUNTER — Ambulatory Visit (HOSPITAL_COMMUNITY)
Admission: RE | Admit: 2018-11-23 | Discharge: 2018-11-23 | Disposition: A | Discharge status: Home / Self Care | Payer: BC Managed Care – PPO | Source: Ambulatory Visit | Attending: Gastroenterology | Admitting: Gastroenterology

## 2018-11-23 DIAGNOSIS — K581 Irritable bowel syndrome with constipation: Secondary | ICD-10-CM

## 2018-11-23 DIAGNOSIS — K200 Eosinophilic esophagitis: Secondary | ICD-10-CM | POA: Diagnosis present

## 2018-11-23 DIAGNOSIS — R1011 Right upper quadrant pain: Secondary | ICD-10-CM | POA: Insufficient documentation

## 2018-11-24 ENCOUNTER — Encounter: Payer: Self-pay | Admitting: Gastroenterology

## 2018-12-10 ENCOUNTER — Other Ambulatory Visit: Payer: Self-pay

## 2018-12-10 ENCOUNTER — Encounter: Payer: Self-pay | Admitting: Gastroenterology

## 2018-12-10 ENCOUNTER — Ambulatory Visit (AMBULATORY_SURGERY_CENTER): Payer: Self-pay | Admitting: *Deleted

## 2018-12-10 VITALS — Temp 96.9°F | Ht 67.0 in | Wt 228.0 lb

## 2018-12-10 DIAGNOSIS — K200 Eosinophilic esophagitis: Secondary | ICD-10-CM

## 2018-12-10 DIAGNOSIS — R1011 Right upper quadrant pain: Secondary | ICD-10-CM

## 2018-12-10 NOTE — Progress Notes (Signed)

## 2018-12-21 ENCOUNTER — Encounter: Payer: BC Managed Care – PPO | Admitting: Gastroenterology

## 2018-12-23 ENCOUNTER — Telehealth: Payer: Self-pay

## 2018-12-23 NOTE — Telephone Encounter (Signed)
Covid-19 screening questions   Do you now or have you had a fever in the last 14 days?  Do you have any respiratory symptoms of shortness of breath or cough now or in the last 14 days?  Do you have any family members or close contacts with diagnosed or suspected Covid-19 in the past 14 days?  Have you been tested for Covid-19 and found to be positive?       

## 2018-12-24 ENCOUNTER — Encounter: Payer: Self-pay | Admitting: Gastroenterology

## 2018-12-24 ENCOUNTER — Other Ambulatory Visit: Payer: Self-pay

## 2018-12-24 ENCOUNTER — Ambulatory Visit (AMBULATORY_SURGERY_CENTER): Payer: BC Managed Care – PPO | Admitting: Gastroenterology

## 2018-12-24 VITALS — BP 119/83 | HR 83 | Temp 98.3°F | Resp 17 | Ht 67.0 in | Wt 228.0 lb

## 2018-12-24 DIAGNOSIS — K3189 Other diseases of stomach and duodenum: Secondary | ICD-10-CM

## 2018-12-24 DIAGNOSIS — K2970 Gastritis, unspecified, without bleeding: Secondary | ICD-10-CM | POA: Diagnosis not present

## 2018-12-24 DIAGNOSIS — K200 Eosinophilic esophagitis: Secondary | ICD-10-CM

## 2018-12-24 DIAGNOSIS — R1310 Dysphagia, unspecified: Secondary | ICD-10-CM

## 2018-12-24 DIAGNOSIS — R1011 Right upper quadrant pain: Secondary | ICD-10-CM

## 2018-12-24 MED ORDER — SODIUM CHLORIDE 0.9 % IV SOLN: 500.0000 mL | Freq: Once | INTRAVENOUS | Status: AC

## 2018-12-24 NOTE — Op Note (Signed)
[facility] Patient Name: Victoria Cruz Procedure Date: 12/24/2018 9:50 AM MRN: [id number] Endoscopist: Lynann Bolognaajesh Kariya Lavergne , MD Age: [age] Referring MD:  Date of Birth: 07/03/1975 Gender: Female Account #: [id number] Procedure:                Upper GI endoscopy Indications:              Dysphagia with previous history of eosinophilic                            esophagitis. Barium swallow showing delayed barium                            tablet. Medicines:                Monitored Anesthesia Care Procedure:                Pre-Anesthesia Assessment:                           - Prior to the procedure, a History and Physical                            was performed, and patient medications and                            allergies were reviewed. The patient's tolerance of                            previous anesthesia was also reviewed. The risks                            and benefits of the procedure and the sedation                            options and risks were discussed with the patient.                            All questions were answered, and informed consent                            was obtained. Prior Anticoagulants: The patient has                            taken no previous anticoagulant or antiplatelet                            agents. ASA Grade Assessment: II - A patient with                            mild systemic disease. After reviewing the risks                            and benefits, the patient was deemed in  satisfactory condition to undergo the procedure.                           After obtaining informed consent, the endoscope was                            passed under direct vision. Throughout the                            procedure, the patient's blood pressure, pulse, and                            oxygen saturations were monitored continuously. The                            Endoscope was introduced through the mouth, and                         advanced to the second part of duodenum. The upper                            GI endoscopy was accomplished without difficulty.                            The patient tolerated the procedure well. Scope In: Scope Out: Findings:                 Mucosal changes including feline appearance and                            longitudinal furrows were found in the entire                            esophagus. Biopsies were obtained from the proximal                            and distal esophagus with cold forceps for                            histology. No definite strictures. The scope was                            withdrawn. Dilation was performed with a Maloney                            dilator with no resistance at 48 Fr and mild                            resistance at 50 Fr.                           Localized mild inflammation characterized by                            erythema was found in the gastric  antrum. Biopsies                            were taken with a cold forceps for histology.                           Three erosions without bleeding were found in the                            first portion of the duodenum. Complications:            No immediate complications. Estimated Blood Loss:     Estimated blood loss: none. Impression:               - Esophageal mucosal changes secondary to                            eosinophilic esophagitis. Biopsied. Dilated.                           - Gastritis. Biopsied.                           - Duodenal erosions without bleeding. Recommendation:           - Patient has a contact number available for                            emergencies. The signs and symptoms of potential                            delayed complications were discussed with the                            patient. Return to normal activities tomorrow.                            Written discharge instructions were provided to the                             patient.                           - Postdilatation diet.                           - No aspirin, ibuprofen, naproxen, or other                            non-steroidal anti-inflammatory drugs.                           - Await pathology results.                           - Avoid nuts                           -  Return to GI clinic in 12 weeks. Lynann Bologna, MD 12/24/2018 10:14:51 AM This report has been signed electronically.

## 2018-12-24 NOTE — Progress Notes (Signed)
To PACU, VSS. Report to [email or web address] 

## 2018-12-24 NOTE — Progress Notes (Signed)
Pt's states no medical or surgical changes since previsit or office visit. 

## 2018-12-24 NOTE — Patient Instructions (Signed)
Impression/Recommendations:  Gastritis handout given to patient.  Post dilation diet handout given to patient. No aspirin, ibuprofen, naproxen, or other NSAID drugs.  Await pathology results.  Avoid nuts.  Return to GI clinic in 12 weeks.  YOU HAD AN ENDOSCOPIC PROCEDURE TODAY AT [facility]:   Refer to the procedure report that was given to you for any specific questions about what was found during the examination.  If the procedure report does not answer your questions, please call your gastroenterologist to clarify.  If you requested that your care partner not be given the details of your procedure findings, then the procedure report has been included in a sealed envelope for you to review at your convenience later.  YOU SHOULD EXPECT: Some feelings of bloating in the abdomen. Passage of more gas than usual.  Walking can help get rid of the air that was put into your GI tract during the procedure and reduce the bloating. If you had a lower endoscopy (such as a colonoscopy or flexible sigmoidoscopy) you may notice spotting of blood in your stool or on the toilet paper. If you underwent a bowel prep for your procedure, you may not have a normal bowel movement for a few days.  Please Note:  You might notice some irritation and congestion in your nose or some drainage.  This is from the oxygen used during your procedure.  There is no need for concern and it should clear up in a day or so.  SYMPTOMS TO REPORT IMMEDIATELY:  Following upper endoscopy (EGD)  Vomiting of blood or coffee ground material  New chest pain or pain under the shoulder blades  Painful or persistently difficult swallowing  New shortness of breath  Fever of 100F or higher  Black, tarry-looking stools  For urgent or emergent issues, a gastroenterologist can be reached at any hour by calling [phone].   DIET:  We do recommend a small meal at first, but then you may proceed to your regular diet.   Drink plenty of fluids but you should avoid alcoholic beverages for 24 hours.  ACTIVITY:  You should plan to take it easy for the rest of today and you should NOT DRIVE or use heavy machinery until tomorrow (because of the sedation medicines used during the test).    FOLLOW UP: Our staff will call the number listed on your records 48-72 hours following your procedure to check on you and address any questions or concerns that you may have regarding the information given to you following your procedure. If we do not reach you, we will leave a message.  We will attempt to reach you two times.  During this call, we will ask if you have developed any symptoms of COVID 19. If you develop any symptoms (ie: fever, flu-like symptoms, shortness of breath, cough etc.) before then, please call [phone].  If you test positive for Covid 19 in the 2 weeks post procedure, please call and report this information to [place].    If any biopsies were taken you will be contacted by phone or by letter within the next 1-3 weeks.  Please call us at [phone] if you have not heard about the biopsies in 3 weeks.    SIGNATURES/CONFIDENTIALITY: You and/or your care partner have signed paperwork which will be entered into your electronic medical record.  These signatures attest to the fact that that the information above on your After Visit Summary has been reviewed and is understood.  Full responsibility of the confidentiality of this discharge information lies with you and/or your care-partner.

## 2018-12-24 NOTE — Progress Notes (Signed)
Pt's states no medical or surgical changes since previsit or office visit.  Temp KA  [facility] [place] 

## 2018-12-28 ENCOUNTER — Telehealth: Payer: Self-pay

## 2018-12-28 NOTE — Telephone Encounter (Signed)
  Follow up Call-  Call back number 12/24/2018 09/10/2017  Post procedure Call Back phone  # [phone]- no mailbox set up yet  Permission to leave phone message Yes No     Patient questions:  Do you have a fever, pain , or abdominal swelling? No. Pain Score  0 *  Have you tolerated food without any problems? Yes.    Have you been able to return to your normal activities? Yes.    Do you have any questions about your discharge instructions: Diet   No. Medications  No. Follow up visit  No.  Do you have questions or concerns about your Care? No.  Actions: * If pain score is 4 or above: No action needed, pain <4. 1. Have you developed a fever since your procedure? no  2.   Have you had an respiratory symptoms (SOB or cough) since your procedure? no  3.   Have you tested positive for COVID 19 since your procedure no  4.   Have you had any family members/close contacts diagnosed with the COVID 19 since your procedure?  no   If yes to any of these questions please route to Joylene John, RN and Alphonsa Gin, Therapist, sports.

## 2019-01-04 ENCOUNTER — Encounter: Payer: Self-pay | Admitting: Gastroenterology

## 2019-01-18 ENCOUNTER — Telehealth: Payer: Self-pay | Admitting: Gastroenterology

## 2019-01-18 ENCOUNTER — Other Ambulatory Visit: Payer: Self-pay

## 2019-01-18 MED ORDER — FLOVENT HFA 110 MCG/ACT IN AERO: INHALATION_SPRAY | RESPIRATORY_TRACT | 12 refills | Status: DC

## 2019-01-18 MED ORDER — FLUTICASONE-SALMETEROL 250-50 MCG/DOSE IN AEPB: INHALATION_SPRAY | RESPIRATORY_TRACT | 1 refills | Status: DC

## 2019-01-18 NOTE — Telephone Encounter (Signed)
Please see additional documentation concerning this patient-duplicate message

## 2019-01-24 ENCOUNTER — Encounter: Payer: Self-pay | Admitting: Allergy and Immunology

## 2019-01-24 ENCOUNTER — Other Ambulatory Visit: Payer: Self-pay

## 2019-01-24 ENCOUNTER — Ambulatory Visit: Payer: BC Managed Care – PPO | Admitting: Allergy and Immunology

## 2019-01-24 VITALS — BP 124/84 | HR 92 | Temp 97.5°F | Resp 16 | Ht 68.0 in | Wt 228.4 lb

## 2019-01-24 DIAGNOSIS — J3089 Other allergic rhinitis: Secondary | ICD-10-CM | POA: Diagnosis not present

## 2019-01-24 DIAGNOSIS — K200 Eosinophilic esophagitis: Secondary | ICD-10-CM | POA: Diagnosis not present

## 2019-01-24 DIAGNOSIS — T781XXD Other adverse food reactions, not elsewhere classified, subsequent encounter: Secondary | ICD-10-CM

## 2019-01-24 DIAGNOSIS — K2100 Gastro-esophageal reflux disease with esophagitis, without bleeding: Secondary | ICD-10-CM

## 2019-01-24 MED ORDER — BUDESONIDE 0.5 MG/2ML IN SUSP: RESPIRATORY_TRACT | 5 refills | Status: DC

## 2019-01-24 NOTE — Patient Instructions (Addendum)
  1.  Egg / Dairy restriction?  2.  Treat and prevent inflammation:   A.  Budesonide 0.5 amp + 5-10 pack Splenda swallowed 2 times a day  3.  Continue to Treat and prevent reflux:   A.  Dexilant 60 mg once a day  B.  Auvi-Q 0.3, benadryl, MD/ER evaluation for allergic reaction  4.  If needed:   A.  OTC antihistamine -Claritin/Zyrtec/Allegra  5.  Return to clinic in 3 weeks or earlier if problem

## 2019-01-24 NOTE — Progress Notes (Signed)
[facility]   Follow-up Note  Referring Provider: Jackquline Denmark, MD Primary Provider: Angelina Sheriff, MD Date of Office Visit: 01/24/2019  Subjective:   Victoria Cruz (DOB: 12-24-1975) is a 43 y.o. female who returns to the [facility] [place] on 01/24/2019 in re-evaluation of the following:  HPI: Jessyca returns to this clinic in evaluation of eosinophilic esophagitis and allergic rhinitis and oral allergy syndrome.  Her last visit to this clinic was 27 Jul 2017.  At that point in time she was utilizing a combination of swallowed fluticasone and Protonix and eliminated dairy and egg and caffeine to address her eosinophilic esophagitis with good results.  She discontinued her Flovent in 2019 because it gave rise to thrush.  She freed up her diet in February 2020 by reinitiating consumption of egg and dairy.  In May 2020 she lost control of her eosinophilic esophagitis and started to have issues with swallowing obstruction.  She reinitiated restriction of egg and dairy but this really did not help and she sought out counsel with Dr. Lyndel Safe who performed an esophageal biopsy in October 2020 which demonstrated eosinophilic esophagitis and she was prescribed an inhaled steroid.  Unfortunately, because of some logistical issues she did not receive her inhaled steroid until last week and this inhaled steroid turned out to be an Advair Diskus instead of a MDI which she has been inhaling.  Her other atopic disease is not a particularly big issue.  She rarely requires any therapy for this issue at this point.  She remains away from eating peanuts and tree nuts because it does give rise to oral itching.  She has never had any associated systemic symptoms with this exposure.  He did receive the flu vaccine this year.  Allergies as of 01/24/2019      Reactions   Mustard Lenon Ahmadi Isothiocyanate]    Onion    Peanut-containing Drug Products    And  possibly all other nuts      Medication List    cyanocobalamin 1000 MCG/ML injection Commonly known as: (VITAMIN B-12) Inject 1,000 mcg into the muscle every 30 (thirty) days.   Dexilant 60 MG capsule Generic drug: dexlansoprazole Take 1 capsule (60 mg total) by mouth daily.   docusate sodium 100 MG capsule Commonly known as: COLACE Take 100 mg by mouth daily.   Fluticasone-Salmeterol 250-50 MCG/DOSE Aepb Commonly known as: Advair Diskus 1 puff twice daily x 1 month then daily x 1 month. Must swallow.  Do not eat or drink or rinse for 20-30 minutes thereafter.  Then can rinse with warm water.   ondansetron 8 MG tablet Commonly known as: ZOFRAN as needed.   senna 8.6 MG tablet Commonly known as: SENOKOT Take 1 tablet by mouth as needed for constipation.       Past Medical History:  Diagnosis Date   Acute bacterial bronchitis    Diverticulitis    Dysphagia    Eosinophilic esophagitis    GERD (gastroesophageal reflux disease)    Hyperlipidemia    no meds   Multiple gastric ulcers     Past Surgical History:  Procedure Laterality Date   APPENDECTOMY  08/21/2017   CHOLECYSTECTOMY     COLON RESECTION  10/2014   COLONOSCOPY  08/21/2014   Colonic poylp status post polypectomy. Mild left colonic diverticulosis.    COLONOSCOPY  01/21/2018   Dr Noberto Retort in [place]   ESOPHAGOGASTRODUODENOSCOPY  05/26/2017   Linear and circular  furrows of the esophagus highly suggestive of eosinophilic esophagitis (biopsied). Mild esophageal stricture status post esophageal dilatation. Multiple gastric ulcers (biopsied)   LAPAROSCOPIC HYSTERECTOMY  08/20/2017   left ovaries    TONSILLECTOMY     UPPER GASTROINTESTINAL ENDOSCOPY      Review of systems negative except as noted in HPI / PMHx or noted below:  Review of Systems  Constitutional: Negative.   HENT: Negative.   Eyes: Negative.   Respiratory: Negative.   Cardiovascular: Negative.   Gastrointestinal:  Negative.   Genitourinary: Negative.   Musculoskeletal: Negative.   Skin: Negative.   Neurological: Negative.   Endo/Heme/Allergies: Negative.   Psychiatric/Behavioral: Negative.      Objective:   Vitals:   01/24/19 1514  BP: 124/84  Pulse: 92  Resp: 16  Temp: (!) 97.5 F (36.4 C)  SpO2: 97%   Height: 5\' 8"  (172.7 cm)  Weight: 228 lb 6.4 oz (103.6 kg)   Physical Exam Constitutional:      Appearance: She is not diaphoretic.  HENT:     Head: Normocephalic.     Right Ear: Tympanic membrane, ear canal and external ear normal.     Left Ear: Tympanic membrane, ear canal and external ear normal.     Nose: Nose normal. No mucosal edema or rhinorrhea.     Mouth/Throat:     Pharynx: Uvula midline. No oropharyngeal exudate.  Eyes:     Conjunctiva/sclera: Conjunctivae normal.  Neck:     Thyroid: No thyromegaly.     Trachea: Trachea normal. No tracheal tenderness or tracheal deviation.  Cardiovascular:     Rate and Rhythm: Normal rate and regular rhythm.     Heart sounds: Normal heart sounds, S1 normal and S2 normal. No murmur.  Pulmonary:     Effort: No respiratory distress.     Breath sounds: Normal breath sounds. No stridor. No wheezing or rales.  Lymphadenopathy:     Head:     Right side of head: No tonsillar adenopathy.     Left side of head: No tonsillar adenopathy.     Cervical: No cervical adenopathy.  Skin:    Findings: No erythema or rash.     Nails: There is no clubbing.   Neurological:     Mental Status: She is alert.     Diagnostics:   Results of an esophageal biopsy obtained 24 December 2018 identified the following:  Surgical mid esophagus and distal esophagus biopsy - SQUAMOUS ESOPHAGEAL MUCOSA WITH MARKED REACTIVE CHANGES AND MODERATELY INCREASED INTRAEPITHELIAL EOSINOPHILS (UP TO 40/HIGH POWER FIELD) CONSISTENT WITH EOSINOPHILIC ESOPHAGITIS INTHE APPROPRIATE CLINICAL SETTING  Results of blood tests obtained 22 January 2019 identified WBC 7.7,  absolute eosinophil 400, absolute lymphocyte 2100, hemoglobin 13.2, platelet 328.  Assessment and Plan:   1. Eosinophilic esophagitis   2. Other allergic rhinitis   3. Pollen-food allergy, subsequent encounter   4. Gastroesophageal reflux disease with esophagitis without hemorrhage     1.  Egg / Dairy restriction?  2.  Treat and prevent inflammation:   A.  Budesonide 0.5 amp + 5-10 pack Splenda swallowed 2 times a day  3.  Continue to Treat and prevent reflux:   A.  Dexilant 60 mg once a day  B. Auvi-Q 0.3, benadryl, MD/ER evaluation for allergic reaction  4.  If needed:   A.  OTC antihistamine -Claritin/Zyrtec/Allegra  5.  Return to clinic in 3 weeks or earlier if problem  I have started Vernona RiegerLaura on swallowed budesonide using a slurry and she  will continue on Dexilant to treat her eosinophilic esophagitis.  It does not appear that she is tolerant of using an MDI formulation of swallowed steroids secondary to the development of thrush.  Currently she is using an Advair Diskus which requires an inhalation and is not going to help this issue.  I will see her back in his clinic in 3 weeks and if she is doing better we will make an attempt to consolidate some of her treatment.  I suspect that she is going to end up using some dose of budesonide slurry on a regular basis for a long time.  Hopefully this dosage will be very little averaging out to just a few times a week.  There does not appear to be any benefit of performing an egg or dairy restriction as she did reinitiate that restriction in May 2020 and for 6 months it did not really help her esophageal obstructive symptoms.  She has a history consistent with oral allergy syndrome when eating certain foods with oral pruritus without any associated systemic or constitutional symptoms.  To be on the safe side we will provide her a injectable epinephrine device.   [name], MD Allergy / Immunology [facility]

## 2019-01-25 ENCOUNTER — Encounter: Payer: Self-pay | Admitting: Allergy and Immunology

## 2019-01-26 ENCOUNTER — Telehealth: Payer: Self-pay

## 2019-01-26 MED ORDER — AUVI-Q 0.3 MG/0.3ML IJ SOAJ: INTRAMUSCULAR | 3 refills | Status: DC

## 2019-01-26 NOTE — Telephone Encounter (Signed)
Called and informed patient of Dr. Bruna Potter message.  I will send in [place] to [facility] for Auvi-Q. Patient voiced understanding.

## 2019-01-26 NOTE — Telephone Encounter (Signed)
-----   Message from Jiles Prows, MD sent at 01/25/2019  7:18 AM EST ----- Please contact patient and let her know that we will send in a prescription for Auvi-Q 0.3 to be utilized in case for oral allergy syndrome ever becomes more of a reaction beyond just oral itchiness.

## 2019-02-14 ENCOUNTER — Ambulatory Visit: Payer: BC Managed Care – PPO | Admitting: Allergy and Immunology

## 2019-02-14 ENCOUNTER — Encounter: Payer: Self-pay | Admitting: Allergy and Immunology

## 2019-02-14 ENCOUNTER — Other Ambulatory Visit: Payer: Self-pay

## 2019-02-14 VITALS — BP 112/86 | HR 72 | Temp 97.3°F | Resp 16

## 2019-02-14 DIAGNOSIS — K2100 Gastro-esophageal reflux disease with esophagitis, without bleeding: Secondary | ICD-10-CM | POA: Diagnosis not present

## 2019-02-14 DIAGNOSIS — K200 Eosinophilic esophagitis: Secondary | ICD-10-CM | POA: Diagnosis not present

## 2019-02-14 DIAGNOSIS — J3089 Other allergic rhinitis: Secondary | ICD-10-CM

## 2019-02-14 DIAGNOSIS — T781XXD Other adverse food reactions, not elsewhere classified, subsequent encounter: Secondary | ICD-10-CM | POA: Diagnosis not present

## 2019-02-14 NOTE — Progress Notes (Signed)
[place] [facility] [place] [facility] [place]   Follow-up Note  Referring Provider: Angelina Sheriff, MD Primary Provider: Angelina Sheriff, MD Date of Office Visit: 02/14/2019  Subjective:   Victoria Cruz (DOB: Dec 02, 1975) is a 43 y.o. female who returns to the [facility] [place] on 02/14/2019 in re-evaluation of the following:  HPI: Norlene returns to this clinic in evaluation of eosinophilic esophagitis and a history of allergic rhinitis and oral allergy syndrome and reflux.  I last saw her in this clinic 24 January 2019 at which point in time we started her on swallowed steroids for her eosinophilic esophagitis.  She has had much less swallowing issue at this point.  She does believe that the budesonide slurry is helping her regarding this issue.  She still has some chest discomfort which has been an issue ever since she had her esophageal dilation with Dr. Lyndel Safe in October.  She has not had any episodes of swallowing obstruction.  She still remains away from eating peanuts and tree nuts at this point.  She is consuming dairy and egg at this point in time.  She believes that her reflux is under very good control.  Allergies as of 02/14/2019      Reactions   Mustard Lenon Ahmadi Isothiocyanate]    Onion    Peanut-containing Drug Products    And possibly all other nuts      Medication List    Auvi-Q 0.3 mg/0.3 mL Soaj injection Generic drug: EPINEPHrine Use as directed for life-threatening allergic reaction.   budesonide 0.5 MG/2ML nebulizer solution Commonly known as: PULMICORT Mix 1 vial with 5-10 packs of Splenda and swallow mixture 2 times daily as directed   cyanocobalamin 1000 MCG/ML injection Commonly known as: (VITAMIN B-12) Inject 1,000 mcg into the muscle every 30 (thirty) days.   Dexilant 60 MG capsule Generic drug: dexlansoprazole Take 1 capsule (60 mg total) by mouth daily.   docusate sodium 100 MG capsule Commonly known as: COLACE  Take 100 mg by mouth daily.   ondansetron 8 MG tablet Commonly known as: ZOFRAN as needed.   senna 8.6 MG tablet Commonly known as: SENOKOT Take 1 tablet by mouth as needed for constipation.       Past Medical History:  Diagnosis Date  . Acute bacterial bronchitis   . Diverticulitis   . Dysphagia   . Eosinophilic esophagitis   . GERD (gastroesophageal reflux disease)   . Hyperlipidemia    no meds  . Multiple gastric ulcers     Past Surgical History:  Procedure Laterality Date  . APPENDECTOMY  08/21/2017  . CHOLECYSTECTOMY    . COLON RESECTION  10/2014  . COLONOSCOPY  08/21/2014   Colonic poylp status post polypectomy. Mild left colonic diverticulosis.   . COLONOSCOPY  01/21/2018   Dr Noberto Retort in [place]  . ESOPHAGOGASTRODUODENOSCOPY  05/26/2017   Linear and circular furrows of the esophagus highly suggestive of eosinophilic esophagitis (biopsied). Mild esophageal stricture status post esophageal dilatation. Multiple gastric ulcers (biopsied)  . LAPAROSCOPIC HYSTERECTOMY  08/20/2017   left ovaries   . TONSILLECTOMY    . UPPER GASTROINTESTINAL ENDOSCOPY      Review of systems negative except as noted in HPI / PMHx or noted below:  Review of Systems  Constitutional: Negative.   HENT: Negative.   Eyes: Negative.   Respiratory: Negative.   Cardiovascular: Negative.   Gastrointestinal: Negative.   Genitourinary: Negative.   Musculoskeletal: Negative.   Skin: Negative.  Neurological: Negative.   Endo/Heme/Allergies: Negative.   Psychiatric/Behavioral: Negative.      Objective:   Vitals:   02/14/19 0840  BP: 112/86  Pulse: 72  Resp: 16  Temp: (!) 97.3 F (36.3 C)          Physical Exam Constitutional:      Appearance: She is not diaphoretic.  HENT:     Head: Normocephalic.     Right Ear: Tympanic membrane, ear canal and external ear normal.     Left Ear: Tympanic membrane, ear canal and external ear normal.     Nose: Nose normal. No mucosal  edema or rhinorrhea.     Mouth/Throat:     Pharynx: Uvula midline. No oropharyngeal exudate.  Eyes:     Conjunctiva/sclera: Conjunctivae normal.  Neck:     Thyroid: No thyromegaly.     Trachea: Trachea normal. No tracheal tenderness or tracheal deviation.  Cardiovascular:     Rate and Rhythm: Normal rate and regular rhythm.     Heart sounds: Normal heart sounds, S1 normal and S2 normal. No murmur.  Pulmonary:     Effort: No respiratory distress.     Breath sounds: Normal breath sounds. No stridor. No wheezing or rales.  Lymphadenopathy:     Head:     Right side of head: No tonsillar adenopathy.     Left side of head: No tonsillar adenopathy.     Cervical: No cervical adenopathy.  Skin:    Findings: No erythema or rash.     Nails: There is no clubbing.   Neurological:     Mental Status: She is alert.     Diagnostics: none  Assessment and Plan:   1. Eosinophilic esophagitis   2. Gastroesophageal reflux disease with esophagitis without hemorrhage   3. Other allergic rhinitis   4. Pollen-food allergy, subsequent encounter     1.  Continue to Treat and prevent inflammation:   A.  Budesonide 0.5 amp + 5-10 pack Splenda swallowed 2 times a day  2.  Continue to Treat and prevent reflux:   A.  Dexilant 60 mg once a day  3.  If needed:   A.  OTC antihistamine -Claritin/Zyrtec/Allegra  B.  Auvi-Q 0.3, benadryl, MD/ER evaluation for allergic reaction  4.  Return to clinic in end of December or earlier if problem  I think there is some improvement regarding Anisah swallowing issue while using her budesonide slurry and we will keep her on this agent while she also remains on therapy directed against reflux.  She will obviously need to avoid consumption of peanut and tree nut.  I will see her back in this clinic in about 7 weeks or so.  [name], MD Allergy / Immunology [facility]

## 2019-02-14 NOTE — Patient Instructions (Signed)
  1.  Continue to Treat and prevent inflammation:   A.  Budesonide 0.5 amp + 5-10 pack Splenda swallowed 2 times a day  2.  Continue to Treat and prevent reflux:   A.  Dexilant 60 mg once a day  3.  If needed:   A.  OTC antihistamine -Claritin/Zyrtec/Allegra  B.  Auvi-Q 0.3, benadryl, MD/ER evaluation for allergic reaction  4.  Return to clinic in end of December or earlier if problem

## 2019-02-15 ENCOUNTER — Encounter: Payer: Self-pay | Admitting: Allergy and Immunology

## 2019-03-01 ENCOUNTER — Ambulatory Visit: Payer: BC Managed Care – PPO | Admitting: Gastroenterology

## 2019-03-01 ENCOUNTER — Other Ambulatory Visit: Payer: Self-pay

## 2019-03-01 ENCOUNTER — Encounter: Payer: Self-pay | Admitting: Gastroenterology

## 2019-03-01 VITALS — BP 114/82 | HR 75 | Temp 98.2°F | Ht 67.0 in | Wt 223.5 lb

## 2019-03-01 DIAGNOSIS — R1011 Right upper quadrant pain: Secondary | ICD-10-CM | POA: Diagnosis not present

## 2019-03-01 DIAGNOSIS — K581 Irritable bowel syndrome with constipation: Secondary | ICD-10-CM | POA: Diagnosis not present

## 2019-03-01 DIAGNOSIS — K200 Eosinophilic esophagitis: Secondary | ICD-10-CM | POA: Diagnosis not present

## 2019-03-01 MED ORDER — AMBULATORY NON FORMULARY MEDICATION: 1 refills | Status: DC

## 2019-03-01 MED ORDER — DEXILANT 60 MG PO CPDR: 60.0000 mg | DELAYED_RELEASE_CAPSULE | Freq: Every day | ORAL | 3 refills | Status: DC

## 2019-03-01 NOTE — Patient Instructions (Signed)
If you are age 45 or older, your body mass index should be between 23-30. Your Body mass index is 35.01 kg/m. If this is out of the aforementioned range listed, please consider follow up with your Primary Care Provider.  If you are age 43 or younger, your body mass index should be between 19-25. Your Body mass index is 35.01 kg/m. If this is out of the aformentioned range listed, please consider follow up with your Primary Care Provider.   Your provider has requested you to have a CT Scan, you will be contacted with this appointment information.   We have sent the following medications to your pharmacy for you to pick up at your convenience: Dexilant GI Cocktail  Follow up in 12 weeks.   Thank you,  Dr. Jackquline Denmark

## 2019-03-01 NOTE — Progress Notes (Signed)
Chief Complaint:   Referring Provider:  Angelina Sheriff, MD      ASSESSMENT AND PLAN;   #1. 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 Seen by Dr Neldon Mc-  Multiple allergies.  #2. Intermittent RUQ pain. H/O s/p lap chole  #3. IBS with constipation.  Plan: - Continue dexilant 60mg  po qd, #30, 11 refills. - Continue budesonide as per Dr. Neldon Mc. - CT Abdo/pelvis for persistent right upper quadrant abdominal pain. - GI coctail 5-24ml Q6hrs. #120 ml, 1 refill.(equal amounts of Maalox, viscous lidocaine and liquid Donnatal or Bentyl).  Use as directed. - Start dulcolax 2 equal amounts of Maalox, tonight. Take 1 colace/day. - Zofran 4mg  ODT Q8hrs #20 prn. - Linzess 5mcg po qd. (samples) - Call in 2 weeks. - FU in 12 weeks.    HPI:    Victoria Cruz is a 43 y.o. female  For follow-up visit. Doing much better from dysphagia standpoint.  Continues to have right upper quad abdominal pain which at times gets worse.  There is associated with nausea but no vomiting.  No jaundice dark urine or pale stools.  No nonsteroidals.  EGD did not reveal any etiology for abdominal pain.  She is quite concerned about the same.  Has also started having right lower quadrant abdominal pain with tenderness.  Did have subjective fever and chills.  Occasional constipation alternating with diarrhea.  No melena or hematochezia.  No weight loss.  Recent colonoscopy by Dr. Noberto Retort on 01/21/2018- neg. recommend repeating at the age 49. (report scanned).  Patient with H/O diverticulitis s/p sigmoid resection 10/2014 Past Medical History:  Diagnosis Date  . Acute bacterial bronchitis   . Diverticulitis   . Dysphagia   . Eosinophilic esophagitis   . GERD (gastroesophageal reflux disease)   . Hyperlipidemia    no meds  . Multiple gastric ulcers     Past Surgical History:  Procedure Laterality Date  . APPENDECTOMY  08/21/2017  .  CHOLECYSTECTOMY    . COLON RESECTION  10/2014  . COLONOSCOPY  08/21/2014   Colonic poylp status post polypectomy. Mild left colonic diverticulosis.   . COLONOSCOPY  01/21/2018   Dr Noberto Retort in [place]  . ESOPHAGOGASTRODUODENOSCOPY  05/26/2017   Linear and circular furrows of the esophagus highly suggestive of eosinophilic esophagitis (biopsied). Mild esophageal stricture status post esophageal dilatation. Multiple gastric ulcers (biopsied)  . LAPAROSCOPIC HYSTERECTOMY  08/20/2017   left ovaries   . TONSILLECTOMY    . UPPER GASTROINTESTINAL ENDOSCOPY      Family History  Problem Relation Age of Onset  . Diabetes Mother   . High blood pressure Mother   . High blood pressure Father   . Asthma Sister   . Breast cancer Maternal Aunt   . Cancer - Other Maternal Aunt        Oral cancer  . Kidney cancer Maternal Uncle   . Lung cancer Paternal Grandfather   . Colon cancer Neg Hx   . Esophageal cancer Neg Hx   . Stomach cancer Neg Hx   . Rectal cancer Neg Hx   . Colon polyps Neg Hx     Social History   Tobacco Use  . Smoking status: Never Smoker  . Smokeless tobacco: Never Used  Substance Use Topics  . Alcohol use: Not Currently    Frequency: Never  . Drug use: Never    Current Outpatient Medications  Medication Sig Dispense Refill  .  AUVI-Q 0.3 MG/0.3ML SOAJ injection Use as directed for life-threatening allergic reaction. 2 each 3  . budesonide (PULMICORT) 0.5 MG/2ML nebulizer solution Mix 1 vial with 5-10 packs of Splenda and swallow mixture 2 times daily as directed 120 mL 5  . cyanocobalamin (,VITAMIN B-12,) 1000 MCG/ML injection Inject 1,000 mcg into the muscle every 30 (thirty) days.     Marland Kitchen dexlansoprazole (DEXILANT) 60 MG capsule Take 1 capsule (60 mg total) by mouth daily. 90 capsule 3  . docusate sodium (COLACE) 100 MG capsule Take 100 mg by mouth daily.     . ondansetron (ZOFRAN) 8 MG tablet as needed.    . senna (SENOKOT) 8.6 MG tablet Take 1 tablet by mouth as  needed for constipation.     Current Facility-Administered Medications  Medication Dose Route Frequency Provider Last Rate Last Dose  . 0.9 %  sodium chloride infusion  500 mL Intravenous Once Lynann Bologna, MD        Allergies  Allergen Reactions  . Arlice Colt Isothiocyanate]   . Onion   . Peanut-Containing Drug Products     And possibly all other nuts    Review of Systems:     Physical Exam:    BP 114/82   Pulse 75   Temp 98.2 F (36.8 C)   Ht 5\' 7"  (1.702 m)   Wt 223 lb 8 oz (101.4 kg)   BMI 35.01 kg/m  Filed Weights   03/01/19 0820  Weight: 223 lb 8 oz (101.4 kg)   HEENT: No jaundice Respiratory: Bilateral clear CVS: S1-S2 normal no S3 or S4.  No murmurs. Abdo: Mild right upper quadrant and right lower quadrant abdominal tenderness without rebound.  Bowel sounds are present.  No definite hepatosplenomegaly. Extremities: No edema.    14/08/20, MD 03/01/2019, 8:57 AM  Cc: 14/10/2018, MD

## 2019-03-02 ENCOUNTER — Telehealth: Payer: Self-pay | Admitting: Gastroenterology

## 2019-03-03 ENCOUNTER — Encounter: Payer: Self-pay | Admitting: Gastroenterology

## 2019-03-03 MED ORDER — LINACLOTIDE 72 MCG PO CAPS: 72.0000 ug | ORAL_CAPSULE | Freq: Every day | ORAL | 6 refills | Status: DC

## 2019-03-03 NOTE — Telephone Encounter (Signed)
Sent prescription to patients pharmacy.  

## 2019-03-15 ENCOUNTER — Telehealth: Payer: Self-pay | Admitting: Gastroenterology

## 2019-03-15 NOTE — Telephone Encounter (Signed)
Please review previous message and advise 

## 2019-03-15 NOTE — Telephone Encounter (Signed)
Results of CT?  Looks like it was scheduled for 12/8.  Was done?? Can take Linzess every other day for now or twice a week Let us know how she is in 2 weeks  RG

## 2019-03-16 NOTE — Telephone Encounter (Signed)
MR request sent to [facility]

## 2019-03-16 NOTE — Telephone Encounter (Signed)
CT scan was completed at [facility] will you please obtain these records-  Called and spoke with patient-patient given recommendations from MD and is agreeable to plan of care; patient advised to call back in 2 weeks for a symptom update; Patient verbalized understanding of information/instructions;  Patient advised to call back to the office at [phone] should questions/concerns arise;

## 2019-03-16 NOTE — Telephone Encounter (Signed)
Records placed on Dr Steve Rattler desk for review of CT scan from 03/03/2019

## 2019-03-23 ENCOUNTER — Other Ambulatory Visit: Payer: Self-pay

## 2019-03-23 ENCOUNTER — Encounter: Payer: Self-pay | Admitting: Allergy and Immunology

## 2019-03-23 ENCOUNTER — Ambulatory Visit: Payer: BC Managed Care – PPO | Admitting: Allergy and Immunology

## 2019-03-23 VITALS — BP 108/80 | HR 77 | Temp 98.8°F | Resp 16

## 2019-03-23 DIAGNOSIS — J3089 Other allergic rhinitis: Secondary | ICD-10-CM

## 2019-03-23 DIAGNOSIS — K200 Eosinophilic esophagitis: Secondary | ICD-10-CM

## 2019-03-23 DIAGNOSIS — T781XXD Other adverse food reactions, not elsewhere classified, subsequent encounter: Secondary | ICD-10-CM

## 2019-03-23 DIAGNOSIS — K2100 Gastro-esophageal reflux disease with esophagitis, without bleeding: Secondary | ICD-10-CM | POA: Diagnosis not present

## 2019-03-23 NOTE — Progress Notes (Signed)
[facility] [place] [facility] [place]   Follow-up Note  Referring Provider: Angelina Sheriff, MD Primary Provider: Angelina Sheriff, MD Date of Office Visit: 03/23/2019  Subjective:   Victoria Cruz (DOB: 02-16-1976) is a 43 y.o. female who returns to the [facility] [place] on 03/23/2019 in re-evaluation of the following:  HPI: Victoria Cruz returns to this clinic in reevaluation of eosinophilic esophagitis and history of oral allergy syndrome and allergic rhinitis and reflux.  I last saw her in this clinic on 14 February 2019.  She has been consistently using her budesonide slurry and consistently using Dexilant to treat her eosinophilic esophagitis and reflux.  She is doing okay in that she has not had any swallowing obstruction but she still has post esophageal dilation chest discomfort that has been a persistent issue since October 2020.  She has a visit to see her gastroenterologist, Dr. Lyndel Cruz, in January 2021 to address this issue in more detail.  She remains away from eating peanuts and tree nuts.  She did obtain the flu vaccine  Allergies as of 03/23/2019      Reactions   Mustard Victoria Cruz Isothiocyanate]    Onion    Peanut-containing Drug Products    And possibly all other nuts      Medication List      AMBULATORY NON FORMULARY MEDICATION Medication Name: GI Cocktail ( equal parts of Maalox, Viscous Lidocaine, Bentyl) Take 5-10 mls every 6 hours as needed.   Auvi-Q 0.3 mg/0.3 mL Soaj injection Generic drug: EPINEPHrine Use as directed for life-threatening allergic reaction.   budesonide 0.5 MG/2ML nebulizer solution Commonly known as: PULMICORT Mix 1 vial with 5-10 packs of Splenda and swallow mixture 2 times daily as directed   cyanocobalamin 1000 MCG/ML injection Commonly known as: (VITAMIN B-12) Inject 1,000 mcg into the muscle every 30 (thirty) days.   Dexilant 60 MG capsule Generic drug: dexlansoprazole Take 1 capsule (60 mg total)  by mouth daily.   docusate sodium 100 MG capsule Commonly known as: COLACE Take 100 mg by mouth daily.   linaclotide 72 MCG capsule Commonly known as: Linzess Take 1 capsule (72 mcg total) by mouth daily before breakfast.   ondansetron 8 MG tablet Commonly known as: ZOFRAN as needed.   senna 8.6 MG tablet Commonly known as: SENOKOT Take 1 tablet by mouth as needed for constipation.       Past Medical History:  Diagnosis Date  . Acute bacterial bronchitis   . Diverticulitis   . Dysphagia   . Eosinophilic esophagitis   . GERD (gastroesophageal reflux disease)   . Hyperlipidemia    no meds  . Multiple gastric ulcers     Past Surgical History:  Procedure Laterality Date  . APPENDECTOMY  08/21/2017  . CHOLECYSTECTOMY    . COLON RESECTION  10/2014  . COLONOSCOPY  08/21/2014   Colonic poylp status post polypectomy. Mild left colonic diverticulosis.   . COLONOSCOPY  01/21/2018   Dr Victoria Cruz in [place]  . ESOPHAGOGASTRODUODENOSCOPY  05/26/2017   Linear and circular furrows of the esophagus highly suggestive of eosinophilic esophagitis (biopsied). Mild esophageal stricture status post esophageal dilatation. Multiple gastric ulcers (biopsied)  . LAPAROSCOPIC HYSTERECTOMY  08/20/2017   left ovaries   . TONSILLECTOMY    . UPPER GASTROINTESTINAL ENDOSCOPY      Review of systems negative except as noted in HPI / PMHx or noted below:  Review of Systems  Constitutional: Negative.   HENT: Negative.  Eyes: Negative.   Respiratory: Negative.   Cardiovascular: Negative.   Gastrointestinal: Negative.   Genitourinary: Negative.   Musculoskeletal: Negative.   Skin: Negative.   Neurological: Negative.   Endo/Heme/Allergies: Negative.   Psychiatric/Behavioral: Negative.      Objective:   Vitals:   03/23/19 0838  BP: 108/80  Pulse: 77  Resp: 16  Temp: 98.8 F (37.1 C)  SpO2: 98%          Physical Exam Constitutional:      Appearance: She is not diaphoretic.    HENT:     Head: Normocephalic.     Right Ear: Tympanic membrane, ear canal and external ear normal.     Left Ear: Tympanic membrane, ear canal and external ear normal.     Nose: Nose normal. No mucosal edema or rhinorrhea.     Mouth/Throat:     Pharynx: Uvula midline. No oropharyngeal exudate.  Eyes:     Conjunctiva/sclera: Conjunctivae normal.  Neck:     Thyroid: No thyromegaly.     Trachea: Trachea normal. No tracheal tenderness or tracheal deviation.  Cardiovascular:     Rate and Rhythm: Normal rate and regular rhythm.     Heart sounds: Normal heart sounds, S1 normal and S2 normal. No murmur.  Pulmonary:     Effort: No respiratory distress.     Breath sounds: Normal breath sounds. No stridor. No wheezing or rales.  Lymphadenopathy:     Head:     Right side of head: No tonsillar adenopathy.     Left side of head: No tonsillar adenopathy.     Cervical: No cervical adenopathy.  Skin:    Findings: No erythema or rash.     Nails: There is no clubbing.  Neurological:     Mental Status: She is alert.     Diagnostics: none  Assessment and Plan:   1. Eosinophilic esophagitis   2. Gastroesophageal reflux disease with esophagitis without hemorrhage   3. Other allergic rhinitis   4. Pollen-food allergy, subsequent encounter     1.  Continue to Treat and prevent inflammation:   A.  Budesonide 0.5 amp + 5-10 pack Splenda swallowed 2 times a day  2.  Continue to Treat and prevent reflux:   A.  Dexilant 60 mg once a day  3.  If needed:   A.  OTC antihistamine -Claritin/Zyrtec/Allegra  B.  Auvi-Q 0.3, benadryl, MD/ER evaluation for allergic reaction  4.  Return to clinic in 12 weeks or earlier if problem  5.  Obtain Covid vaccine when available  [name] appears to be doing okay at this point in time and I would recommend that she remain on her current dose of budesonide and Dexilant for another 12 weeks and then if she does well and there is no significant abnormality that  can be identified giving rise to her post esophageal dilation chest discomfort with her upcoming visit with Dr. Chales Cruz then there may be an opportunity to consolidate some of her medical therapy when she returns to this clinic in 12 weeks.  Victoria Schimke, MD Allergy / Immunology [facility]

## 2019-03-23 NOTE — Patient Instructions (Addendum)
  1.  Continue to Treat and prevent inflammation:   A.  Budesonide 0.5 amp + 5-10 pack Splenda swallowed 2 times a day  2.  Continue to Treat and prevent reflux:   A.  Dexilant 60 mg once a day  3.  If needed:   A.  OTC antihistamine -Claritin/Zyrtec/Allegra  B.  Auvi-Q 0.3, benadryl, MD/ER evaluation for allergic reaction  4.  Return to clinic in 12 weeks or earlier if problem  5.  Obtain Covid vaccine when available

## 2019-03-24 ENCOUNTER — Encounter: Payer: Self-pay | Admitting: Allergy and Immunology

## 2019-03-31 NOTE — Telephone Encounter (Signed)
Called and spoke with patient-patient informed of result note and MD recommendations; patient is agreeable with plan of care and has been scheduled for a f/u OV on 05/19/2019 at 8:30 am; Patient verbalized understanding of information/instructions;  Patient was advised to call the office at [phone] if questions/concerns arise;

## 2019-03-31 NOTE — Telephone Encounter (Signed)
CT 03/03/2019 at [facility] -No acute CT findings to explain right upper quadrant abdominal pain -Fatty liver without liver cirrhosis. -Small nonobstructive right renal calculus -Postoperative findings of cholecystectomy, appendicectomy, sigmoid colon resection and hysterectomy.  No small bowel obstruction.  Bre Please let her know regarding above results. CT report sent for scanning. Plan as per last note.  RG

## 2019-05-19 ENCOUNTER — Ambulatory Visit: Payer: BC Managed Care – PPO | Admitting: Gastroenterology

## 2019-06-15 ENCOUNTER — Other Ambulatory Visit: Payer: Self-pay

## 2019-06-15 ENCOUNTER — Ambulatory Visit (INDEPENDENT_AMBULATORY_CARE_PROVIDER_SITE_OTHER): Payer: BC Managed Care – PPO | Admitting: Allergy and Immunology

## 2019-06-15 ENCOUNTER — Encounter: Payer: Self-pay | Admitting: Allergy and Immunology

## 2019-06-15 VITALS — BP 112/72 | HR 76 | Temp 97.5°F | Resp 16

## 2019-06-15 DIAGNOSIS — K200 Eosinophilic esophagitis: Secondary | ICD-10-CM

## 2019-06-15 DIAGNOSIS — K2100 Gastro-esophageal reflux disease with esophagitis, without bleeding: Secondary | ICD-10-CM

## 2019-06-15 DIAGNOSIS — T781XXD Other adverse food reactions, not elsewhere classified, subsequent encounter: Secondary | ICD-10-CM

## 2019-06-15 NOTE — Progress Notes (Signed)
[place] [facility] [place] [facility] [place]   Follow-up Note  Referring Provider: Angelina Sheriff, MD Primary Provider: Angelina Sheriff, MD Date of Office Visit: 06/15/2019  Subjective:   Victoria Cruz (DOB: 13-May-1975) is a 44 y.o. female who returns to the [facility] [place] on 06/15/2019 in re-evaluation of the following:  HPI: Victoria Cruz returns to this clinic in reevaluation of her eosinophilic esophagitis and history of oral allergy syndrome and allergic rhinitis and reflux.  Her last visit to this clinic was 23 March 2019.  Her swelling is going much better.  And she has resolved her post esophageal dilation chest discomfort.  She continues on budesonide slurry twice a day and Dexilant.  She remains away from eating peanuts and tree nuts.  She has received 2 Moderna Covid vaccinations.  She informs me that her husband has been diagnosed with what sounds like metastatic colon cancer requiring partial liver resection in the context of chemotherapy administered 2017 for his primary colon Neoplasm.  Allergies as of 06/15/2019      Reactions   Mustard Victoria Cruz Isothiocyanate]    Onion    Peanut-containing Drug Products    And possibly all other nuts      Medication List      AMBULATORY NON FORMULARY MEDICATION Medication Name: GI Cocktail ( equal parts of Maalox, Viscous Lidocaine, Bentyl) Take 5-10 mls every 6 hours as needed.   Auvi-Q 0.3 mg/0.3 mL Soaj injection Generic drug: EPINEPHrine Use as directed for life-threatening allergic reaction.   budesonide 0.5 MG/2ML nebulizer solution Commonly known as: PULMICORT Mix 1 vial with 5-10 packs of Splenda and swallow mixture 2 times daily as directed   cyanocobalamin 1000 MCG/ML injection Commonly known as: (VITAMIN B-12) Inject 1,000 mcg into the muscle every 30 (thirty) days.   cyclobenzaprine 10 MG tablet Commonly known as: FLEXERIL Take 10 mg by mouth at bedtime as needed.   Dexilant  60 MG capsule Generic drug: dexlansoprazole Take 1 capsule (60 mg total) by mouth daily.   docusate sodium 100 MG capsule Commonly known as: COLACE Take 100 mg by mouth daily.   linaclotide 72 MCG capsule Commonly known as: Linzess Take 1 capsule (72 mcg total) by mouth daily before breakfast.   ondansetron 8 MG tablet Commonly known as: ZOFRAN as needed.   pregabalin 75 MG capsule Commonly known as: LYRICA TAKE 1 CAPSULE BY MOUTH TWICE DAILY FOR 5 TO 7 DAYS THEN INCREASE TO 2 CAPSULES TWICE DAILY AS TOLERATED.   senna 8.6 MG tablet Commonly known as: SENOKOT Take 1 tablet by mouth as needed for constipation.       Past Medical History:  Diagnosis Date  . Acute bacterial bronchitis   . Diverticulitis   . Dysphagia   . Eosinophilic esophagitis   . GERD (gastroesophageal reflux disease)   . Hyperlipidemia    no meds  . Multiple gastric ulcers     Past Surgical History:  Procedure Laterality Date  . APPENDECTOMY  08/21/2017  . CHOLECYSTECTOMY    . COLON RESECTION  10/2014  . COLONOSCOPY  08/21/2014   Colonic poylp status post polypectomy. Mild left colonic diverticulosis.   . COLONOSCOPY  01/21/2018   Dr Noberto Retort in [place]  . ESOPHAGOGASTRODUODENOSCOPY  05/26/2017   Linear and circular furrows of the esophagus highly suggestive of eosinophilic esophagitis (biopsied). Mild esophageal stricture status post esophageal dilatation. Multiple gastric ulcers (biopsied)  . LAPAROSCOPIC HYSTERECTOMY  08/20/2017   left ovaries   .  TONSILLECTOMY    . UPPER GASTROINTESTINAL ENDOSCOPY      Review of systems negative except as noted in HPI / PMHx or noted below:  Review of Systems  Constitutional: Negative.   HENT: Negative.   Eyes: Negative.   Respiratory: Negative.   Cardiovascular: Negative.   Gastrointestinal: Negative.   Genitourinary: Negative.   Musculoskeletal: Negative.   Skin: Negative.   Neurological: Negative.   Endo/Heme/Allergies: Negative.     Psychiatric/Behavioral: Negative.      Objective:   Vitals:   06/15/19 0835  BP: 112/72  Pulse: 76  Resp: 16  Temp: (!) 97.5 F (36.4 C)  SpO2: 96%          Physical Exam Constitutional:      Appearance: She is not diaphoretic.  HENT:     Head: Normocephalic.     Right Ear: Tympanic membrane, ear canal and external ear normal.     Left Ear: Tympanic membrane, ear canal and external ear normal.     Nose: Nose normal. No mucosal edema or rhinorrhea.     Mouth/Throat:     Pharynx: Uvula midline. No oropharyngeal exudate.  Eyes:     Conjunctiva/sclera: Conjunctivae normal.  Neck:     Thyroid: No thyromegaly.     Trachea: Trachea normal. No tracheal tenderness or tracheal deviation.  Cardiovascular:     Rate and Rhythm: Normal rate and regular rhythm.     Heart sounds: Normal heart sounds, S1 normal and S2 normal. No murmur.  Pulmonary:     Effort: No respiratory distress.     Breath sounds: Normal breath sounds. No stridor. No wheezing or rales.  Lymphadenopathy:     Head:     Right side of head: No tonsillar adenopathy.     Left side of head: No tonsillar adenopathy.     Cervical: No cervical adenopathy.  Skin:    Findings: No erythema or rash.     Nails: There is no clubbing.  Neurological:     Mental Status: She is alert.     Diagnostics: none  Assessment and Plan:   1. Eosinophilic esophagitis   2. Gastroesophageal reflux disease with esophagitis without hemorrhage   3. Pollen-food allergy, subsequent encounter     1.  Continue to Treat and prevent inflammation:   A.  DECREASE Budesonide 0.5 amp slurry 1 time per day  2.  Continue to Treat and prevent reflux:   A.  Dexilant 60 mg once a day  3.  If needed:   A.  OTC antihistamine -Claritin/Zyrtec/Allegra  B.  Auvi-Q 0.3, benadryl, MD/ER evaluation for allergic reaction  4.  Return to clinic in September 2021 or earlier if problem  [name] appears to have had significant improvement using her  budesonide slurry and her Dexilant regarding her eosinophilic esophagitis and I think there is an opportunity to consolidate her treatment today and will have her use budesonide slurry 1 time per day.  Assuming she does well I will see her back in this clinic in September 2021.  If she continues to do well with that visit then there is a decision to be made about continuing her on budesonide slurry or discontinuing this medication with the assumption that she may be a long-term responder regarding treatment of her eosinophilic esophagitis.  [name], MD Allergy / Immunology [facility]

## 2019-06-15 NOTE — Patient Instructions (Signed)
  1.  Continue to Treat and prevent inflammation:   A.  DECREASE Budesonide 0.5 amp slurry 1 time per day  2.  Continue to Treat and prevent reflux:   A.  Dexilant 60 mg once a day  3.  If needed:   A.  OTC antihistamine -Claritin/Zyrtec/Allegra  B.  Auvi-Q 0.3, benadryl, MD/ER evaluation for allergic reaction  4.  Return to clinic in September 2021 or earlier if problem

## 2019-06-16 ENCOUNTER — Encounter: Payer: Self-pay | Admitting: Allergy and Immunology

## 2019-11-25 IMAGING — RF DG ESOPHAGUS
9 of 12 series · 15 of 21 positions shown · non-contrast
Comparison: None.

CLINICAL DATA: 43-year-old female with history of eosinophilic
esophagitis. Prior history of esophageal stricture.

EXAM:
ESOPHOGRAM / BARIUM SWALLOW / BARIUM TABLET STUDY
TECHNIQUE: Combined double contrast and single contrast examination performed
using effervescent crystals, thick barium liquid, and thin barium
liquid. The patient was observed with fluoroscopy swallowing a 13 mm
barium sulphate tablet.
FLUOROSCOPY TIME:  Fluoroscopy Time:  25.8 mGy
Radiation Exposure Index (if provided by the fluoroscopic device): 2
minutes and 36 seconds

[Series 1: fluoro_barium 2fps_bw · 0.17mm/px · 1 of 1 slices shown (1 of 5)]
[im 1/1]
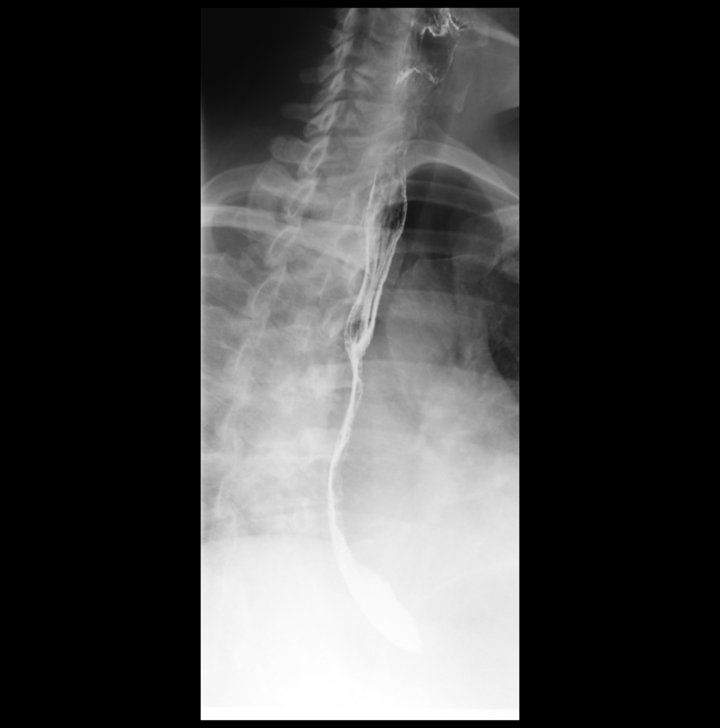

[Series 3: fluoro_barium 2fps_bw · 0.17mm/px · 1 of 1 slices shown (2 of 5)]
[im 1/1]
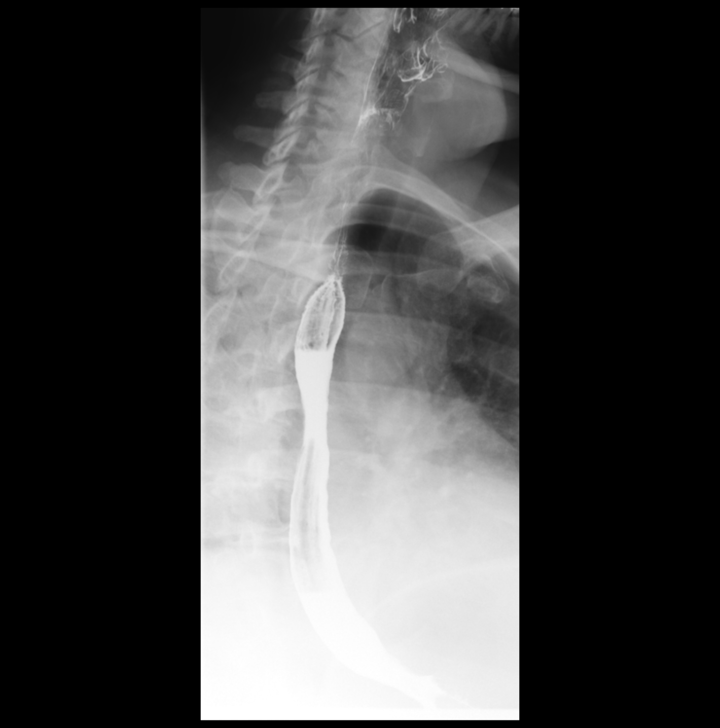

[Series 4: fluoro_barium 2fps_bw · 0.17mm/px · 1 of 1 slices shown (3 of 5)]
[im 1/1]
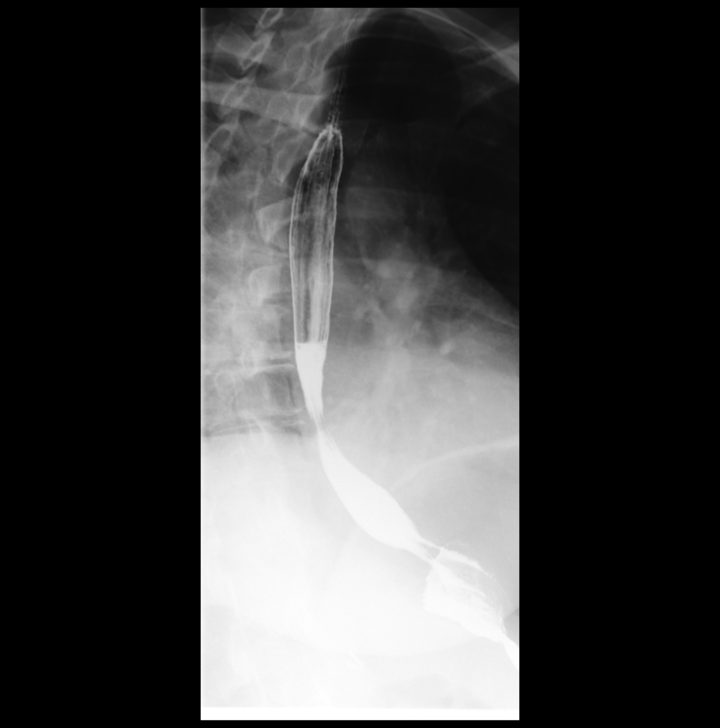

[Series 5: cp_standard · 0.51mm/px · 3 of 33 frames shown (1 of 4)]
[frame 5/33]
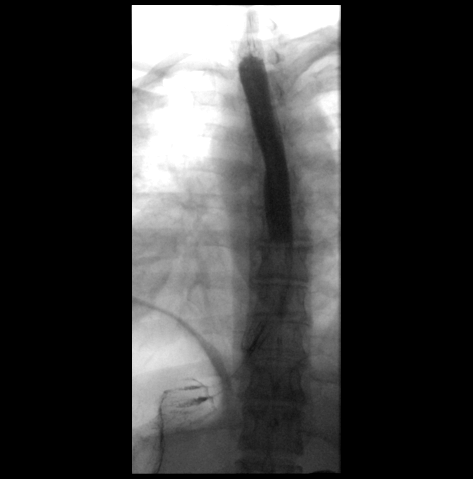
[frame 22/33]
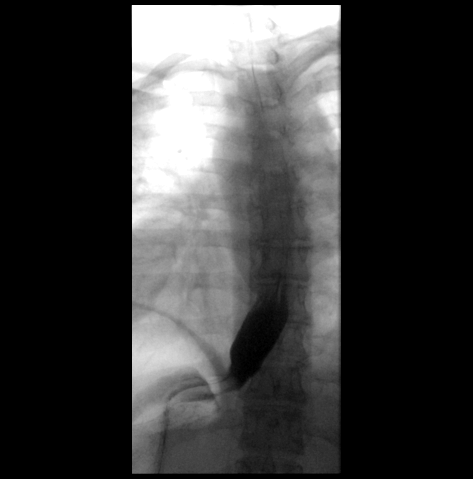
[frame 29/33]
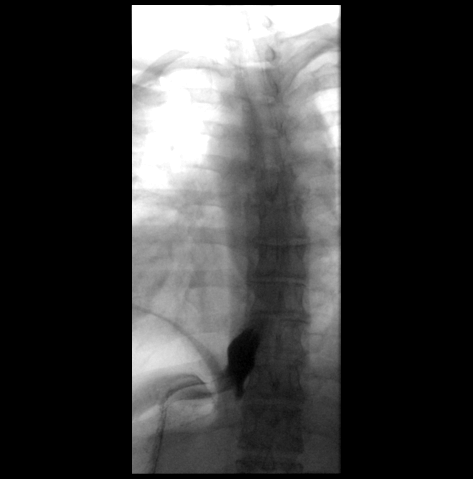

[Series 6: cp_standard · 0.51mm/px · 3 of 35 frames shown (2 of 4)]
[frame 18/35]
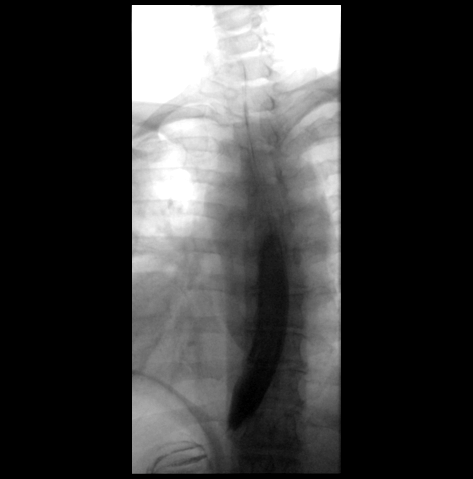
[frame 30/35]
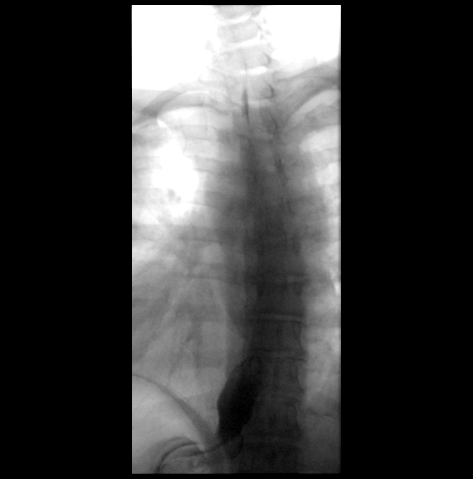
[frame 35/35]
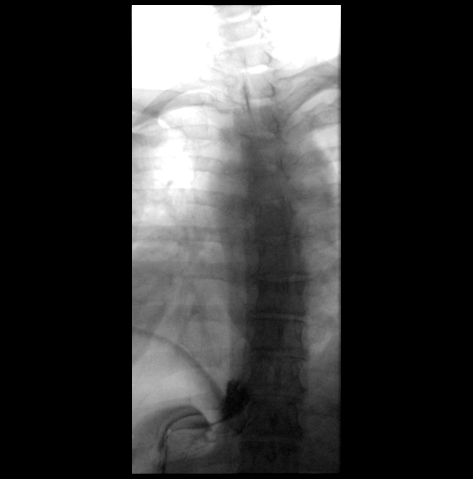

[Series 8: fluoro_barium 2fps_bw · 0.17mm/px · 1 of 1 slices shown (4 of 5)]
[im 1/1]
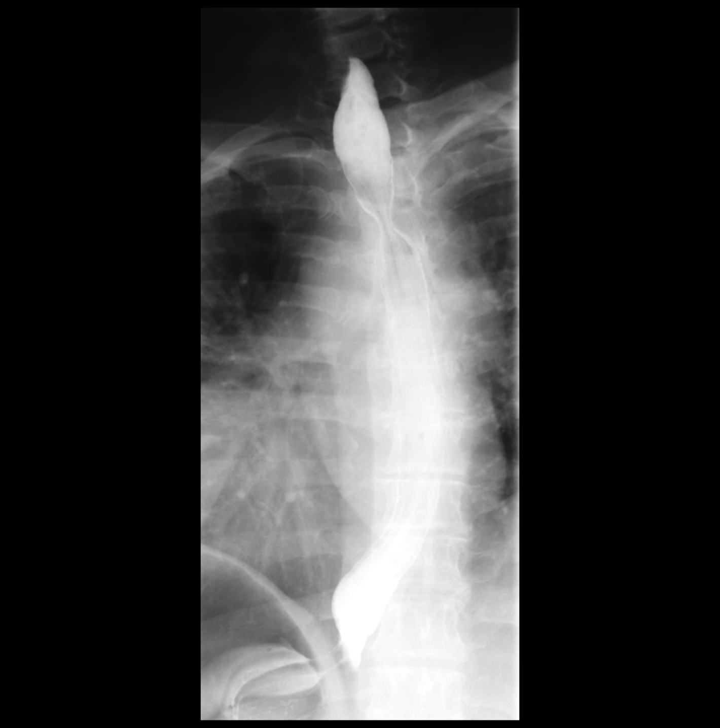

[Series 9: fluoro_barium 2fps_bw · 0.17mm/px · 1 of 1 slices shown (5 of 5)]
[im 1/1]
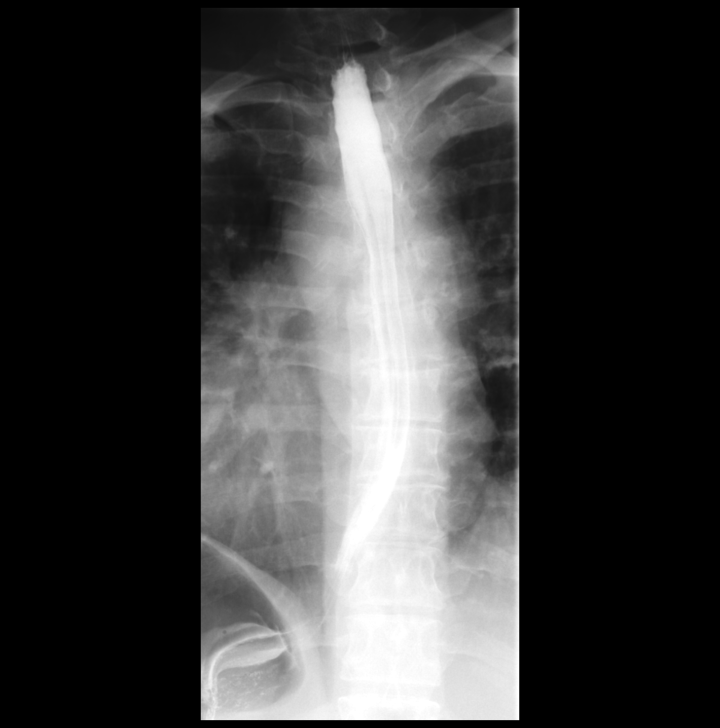

[Series 10: cp_standard · 0.51mm/px · 3 of 57 frames shown (3 of 4)]
[frame 29/57]
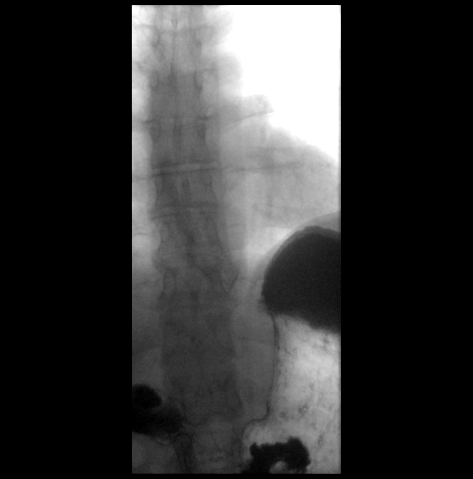
[frame 41/57]
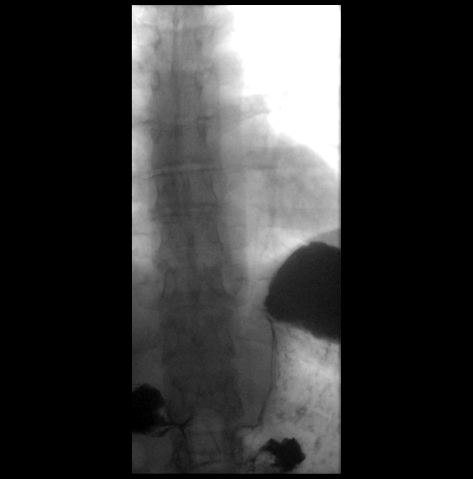
[frame 49/57]
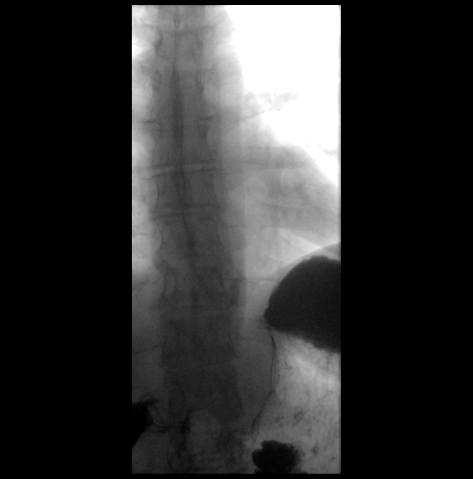

[Series 12: cp_standard · 0.25mm/px · 1 of 1 slices shown (4 of 4)]
[im 1/1]
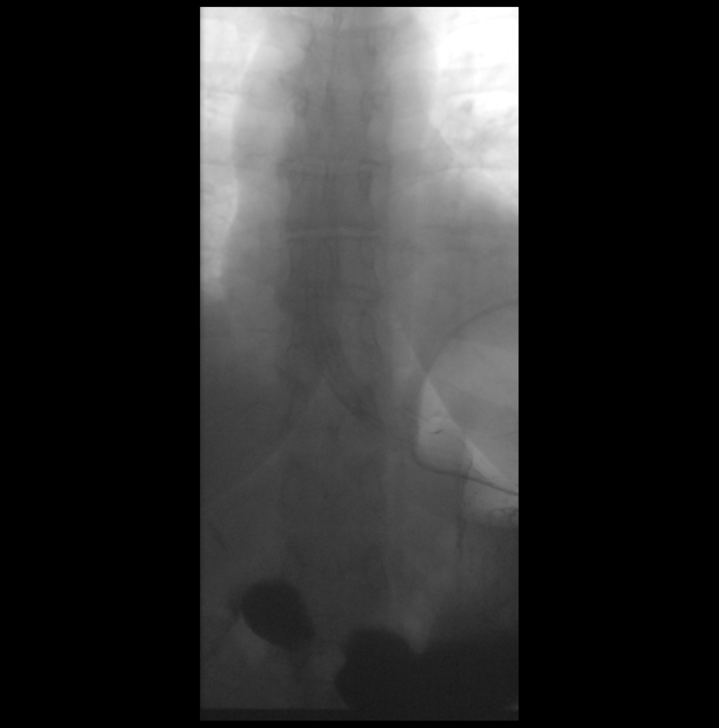

[15 of 21 positions shown; findings below may reference images not displayed]

FINDINGS: Double contrast images demonstrated a normal appearance of the
esophageal mucosa. Multiple single swallow attempts were observed,
which demonstrated normal esophageal motility. Full column
esophagram demonstrated no esophageal mass, stricture or esophageal
ring. No hiatal hernia. Water siphon test demonstrated no
gastroesophageal reflux. A barium tablet was administered, which
passed readily through the proximal and mid esophagus. The tablet
did pass into the stomach, after a short delay at the
gastroesophageal junction which required several additional sips of
water and barium to get the tablet to pass.
IMPRESSION: 1. Very mild delay of tablet at gastroesophageal junction. The
tablet did pass, so this is not diagnostic of stricture, however,
the mild delayed was notable during the examination.
2. Otherwise, normal esophagram, as above.

## 2019-12-19 ENCOUNTER — Other Ambulatory Visit: Payer: Self-pay

## 2019-12-19 ENCOUNTER — Ambulatory Visit: Payer: BC Managed Care – PPO | Admitting: Allergy and Immunology

## 2019-12-19 ENCOUNTER — Encounter: Payer: Self-pay | Admitting: Allergy and Immunology

## 2019-12-19 VITALS — BP 114/82 | HR 78 | Resp 18

## 2019-12-19 DIAGNOSIS — K200 Eosinophilic esophagitis: Secondary | ICD-10-CM | POA: Diagnosis not present

## 2019-12-19 DIAGNOSIS — J3089 Other allergic rhinitis: Secondary | ICD-10-CM

## 2019-12-19 DIAGNOSIS — K219 Gastro-esophageal reflux disease without esophagitis: Secondary | ICD-10-CM

## 2019-12-19 DIAGNOSIS — T781XXD Other adverse food reactions, not elsewhere classified, subsequent encounter: Secondary | ICD-10-CM | POA: Diagnosis not present

## 2019-12-19 NOTE — Progress Notes (Signed)
- [place] [facility] [place] [facility] [place] - [place]   Follow-up Note  Referring Provider: Noni Saupe, MD Primary Provider: Noni Saupe, MD Date of Office Visit: 12/19/2019  Subjective:   Victoria Cruz (DOB: 10-25-1975) is a 44 y.o. female who [facility]2019 in re-evaluation of the following:  HPI: Victoria Cruz returns to this clinic in reevaluation of eosinophilic esophagitis, oral allergy syndrome, allergic rhinitis, and reflux.  Her last visit to this clinic was 15 June 2019.  Victoria Cruz has noticed over the course of the past month that she has had a little bit more problems with discomfort swallowing and some slight cough when she swallows.  These are her usual symptoms associated with her eosinophilic esophagitis.  She increased her budesonide to twice a day for the past 3 weeks and she is going to contact Dr. Chales Abrahams, gastroenterology, to have a repeat upper endoscopy and dilation.  She once again remains away from eating all peanuts and tree nuts.  She has had very little problems with her allergic rhinitis while using an antihistamine occasionally.  Allergies as of 12/19/2019      Reactions   Mustard Jonelle Sports Isothiocyanate]    Onion    Peanut-containing Drug Products    And possibly all other nuts      Medication List    AMBULATORY NON FORMULARY MEDICATION Medication Name: GI Cocktail ( equal parts of Maalox, Viscous Lidocaine, Bentyl) Take 5-10 mls every 6 hours as needed.   Auvi-Q 0.3 mg/0.3 mL Soaj injection Generic drug: EPINEPHrine Use as directed for life-threatening allergic reaction.   budesonide 0.5 MG/2ML nebulizer solution Commonly known as: PULMICORT Mix 1 vial with 5-10 packs of Splenda and swallow mixture 2 times daily as directed What changed: additional instructions   cyanocobalamin 1000 MCG/ML injection Commonly known as: (VITAMIN B-12) Inject 1,000 mcg into the muscle every 30 (thirty) days.     Dexilant 60 MG capsule Generic drug: dexlansoprazole Take 1 capsule (60 mg total) by mouth daily.   docusate sodium 100 MG capsule Commonly known as: COLACE Take 100 mg by mouth daily.   linaclotide 72 MCG capsule Commonly known as: Linzess Take 1 capsule (72 mcg total) by mouth daily before breakfast.   ondansetron 8 MG tablet Commonly known as: ZOFRAN as needed.   senna 8.6 MG tablet Commonly known as: SENOKOT Take 1 tablet by mouth as needed for constipation.       Past Medical History:  Diagnosis Date  . Acute bacterial bronchitis   . Diverticulitis   . Dysphagia   . Eosinophilic esophagitis   . GERD (gastroesophageal reflux disease)   . Hyperlipidemia    no meds  . Multiple gastric ulcers     Past Surgical History:  Procedure Laterality Date  . APPENDECTOMY  08/21/2017  . CHOLECYSTECTOMY    . COLON RESECTION  10/2014  . COLONOSCOPY  08/21/2014   Colonic poylp status post polypectomy. Mild left colonic diverticulosis.   . COLONOSCOPY  01/21/2018   Dr Georgiana Shore in [place]  . ESOPHAGOGASTRODUODENOSCOPY  05/26/2017   Linear and circular furrows of the esophagus highly suggestive of eosinophilic esophagitis (biopsied). Mild esophageal stricture status post esophageal dilatation. Multiple gastric ulcers (biopsied)  . LAPAROSCOPIC HYSTERECTOMY  08/20/2017   left ovaries   . TONSILLECTOMY    . UPPER GASTROINTESTINAL ENDOSCOPY      Review of systems negative except as noted in HPI / PMHx or noted below:  Review  of Systems  Constitutional: Negative.   HENT: Negative.   Eyes: Negative.   Respiratory: Negative.   Cardiovascular: Negative.   Gastrointestinal: Negative.   Genitourinary: Negative.   Musculoskeletal: Negative.   Skin: Negative.   Neurological: Negative.   Endo/Heme/Allergies: Negative.   Psychiatric/Behavioral: Negative.      Objective:   Vitals:   12/19/19 0844  BP: 114/82  Pulse: 78  Resp: 18  SpO2: 97%          Physical  Exam Constitutional:      Appearance: She is not diaphoretic.  HENT:     Head: Normocephalic.     Right Ear: Tympanic membrane, ear canal and external ear normal.     Left Ear: Tympanic membrane, ear canal and external ear normal.     Nose: Nose normal. No mucosal edema or rhinorrhea.     Mouth/Throat:     Pharynx: Uvula midline. No oropharyngeal exudate.  Eyes:     Conjunctiva/sclera: Conjunctivae normal.  Neck:     Thyroid: No thyromegaly.     Trachea: Trachea normal. No tracheal tenderness or tracheal deviation.  Cardiovascular:     Rate and Rhythm: Normal rate and regular rhythm.     Heart sounds: Normal heart sounds, S1 normal and S2 normal. No murmur heard.   Pulmonary:     Effort: No respiratory distress.     Breath sounds: Normal breath sounds. No stridor. No wheezing or rales.  Lymphadenopathy:     Head:     Right side of head: No tonsillar adenopathy.     Left side of head: No tonsillar adenopathy.     Cervical: No cervical adenopathy.  Skin:    Findings: No erythema or rash.     Nails: There is no clubbing.  Neurological:     Mental Status: She is alert.     Diagnostics: none  Assessment and Plan:   1. Eosinophilic esophagitis   2. LPRD (laryngopharyngeal reflux disease)   3. Pollen-food allergy, subsequent encounter   4. Other allergic rhinitis     1.  Continue to Treat and prevent inflammation:   A.  Budesonide 0.5 amp slurry 1- 2 times per day  2.  Continue to Treat and prevent reflux:   A.  Dexilant 60 mg once a day  3.  If needed:   A.  OTC antihistamine -Claritin/Zyrtec/Allegra  B.  Auvi-Q 0.3, benadryl, MD/ER evaluation for allergic reaction  4.  Return to clinic in 1 year or earlier if problem  5. Obtain fall flu vaccine and Covid booster  When Victoria Cruz obtains her upper endoscopy her esophageal biopsy will give Victoria Cruz a fair amount information about whether or not budesonide is working well enough regarding her eosinophilic esophagitis.  She  has considered a FFED diet but does not think that this is logistically possible.  She will remain on the therapy noted above and I will see her back in this clinic in 1 year or earlier if there is a problem.  Victoria Schimke, MD Allergy / Immunology [facility]

## 2019-12-19 NOTE — Patient Instructions (Addendum)
°  1.  Continue to Treat and prevent inflammation:   A.  Budesonide 0.5 amp slurry 1- 2 times per day  2.  Continue to Treat and prevent reflux:   A.  Dexilant 60 mg once a day  3.  If needed:   A.  OTC antihistamine -Claritin/Zyrtec/Allegra  B.  Auvi-Q 0.3, benadryl, MD/ER evaluation for allergic reaction  4.  Return to clinic in 1 year or earlier if problem  5. Obtain fall flu vaccine and Covid booster

## 2019-12-20 ENCOUNTER — Encounter: Payer: Self-pay | Admitting: Allergy and Immunology

## 2020-02-24 ENCOUNTER — Ambulatory Visit: Payer: BC Managed Care – PPO | Admitting: Gastroenterology

## 2020-02-24 ENCOUNTER — Encounter: Payer: Self-pay | Admitting: Gastroenterology

## 2020-02-24 VITALS — BP 134/88 | HR 86 | Ht 67.0 in | Wt 202.0 lb

## 2020-02-24 DIAGNOSIS — K222 Esophageal obstruction: Secondary | ICD-10-CM | POA: Diagnosis not present

## 2020-02-24 DIAGNOSIS — K581 Irritable bowel syndrome with constipation: Secondary | ICD-10-CM

## 2020-02-24 DIAGNOSIS — R1011 Right upper quadrant pain: Secondary | ICD-10-CM | POA: Diagnosis not present

## 2020-02-24 MED ORDER — AMBULATORY NON FORMULARY MEDICATION: 1 refills | Status: DC

## 2020-02-24 MED ORDER — DEXILANT 60 MG PO CPDR: 60.0000 mg | DELAYED_RELEASE_CAPSULE | Freq: Every day | ORAL | 11 refills | Status: DC

## 2020-02-24 MED ORDER — ONDANSETRON HCL 4 MG PO TABS: 4.0000 mg | ORAL_TABLET | Freq: Three times a day (TID) | ORAL | 0 refills | Status: DC | PRN

## 2020-02-24 NOTE — Progress Notes (Signed)
Chief Complaint:   Referring Provider:  Noni Saupe, MD      ASSESSMENT AND PLAN;   #1. 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 Followed by Dr Lucie Leather-  Multiple allergies.  #2. Intermittent RUQ pain. H/O lap chole. Neg CT AP 03/2019 #3. IBS with constipation.  Plan: - Continue dexilant 60mg  po qd, #30, 11 refills. - Continue budesonide as per Dr. . - GI coctail 5-32ml Q6hrs. #120 ml, 1 refill.(equal amounts of Maalox, viscous lidocaine and liquid Donnatal or Bentyl).  Use as directed. - Continue dulcolax prn - Zofran 4mg  ODT Q8hrs #20 prn. - Linzess 145 mcg po qd. (samples) - FU in 24 weeks.    HPI:    Victoria Cruz is a 45 y.o. female  For follow-up visit. (Husband had metaststic Colon Ca s/p partial liver resection at [facility]) Doing much better from dysphagia standpoint. One episode 2 weeks ago  Continues to have right upper quad abdominal pain which at times gets worse.  There is associated with nausea but no vomiting.  No jaundice dark urine or pale stools.  No nonsteroidals.  EGD did not reveal any etiology for abdominal pain.  Negative CT Abdo/pelvis with p.o. and IV contrast as above  Has also started having right lower quadrant abdominal pain with tenderness.  Did have subjective fever and chills.  Occasional constipation alternating with diarrhea.  This is despite Linzess 72 mcg p.o. once a day.  No melena or hematochezia.  No weight loss.  Recent colonoscopy by Dr. 47 on 01/21/2018- neg. recommend repeating at the age 30. (report scanned).  Patient with H/O diverticulitis s/p sigmoid resection 10/2014 Past Medical History:  Diagnosis Date  . Acute bacterial bronchitis   . Diverticulitis   . Dysphagia   . Eosinophilic esophagitis   . GERD (gastroesophageal reflux disease)   . Hyperlipidemia    no meds  . Multiple gastric ulcers     Past Surgical History:  Procedure Laterality Date  .  APPENDECTOMY  08/21/2017  . CHOLECYSTECTOMY    . COLON RESECTION  10/2014  . COLONOSCOPY  08/21/2014   Colonic poylp status post polypectomy. Mild left colonic diverticulosis.   . COLONOSCOPY  01/21/2018   Dr 08/23/2014 in [place]  . ESOPHAGOGASTRODUODENOSCOPY  05/26/2017   Linear and circular furrows of the esophagus highly suggestive of eosinophilic esophagitis (biopsied). Mild esophageal stricture status post esophageal dilatation. Multiple gastric ulcers (biopsied)  . LAPAROSCOPIC HYSTERECTOMY  08/20/2017   left ovaries   . TONSILLECTOMY    . UPPER GASTROINTESTINAL ENDOSCOPY      Family History  Problem Relation Age of Onset  . Diabetes Mother   . High blood pressure Mother   . High blood pressure Father   . Asthma Sister   . Other Sister        EOE  . Breast cancer Maternal Aunt   . Cancer - Other Maternal Aunt        Oral cancer  . Kidney cancer Maternal Uncle   . Lung cancer Paternal Grandfather   . Colon cancer Neg Hx   . Esophageal cancer Neg Hx   . Stomach cancer Neg Hx   . Rectal cancer Neg Hx   . Colon polyps Neg Hx     Social History   Tobacco Use  . Smoking status: Never Smoker  . Smokeless tobacco: Never Used  Vaping Use  . Vaping Use: Never used  Substance Use Topics  . Alcohol use: Not Currently  . Drug use: Never    Current Outpatient Medications  Medication Sig Dispense Refill  . AMBULATORY NON FORMULARY MEDICATION Medication Name: GI Cocktail ( equal parts of Maalox, Viscous Lidocaine, Bentyl) Take 5-10 mls every 6 hours as needed. 120 mL 1  . AUVI-Q 0.3 MG/0.3ML SOAJ injection Use as directed for life-threatening allergic reaction. 2 each 3  . budesonide (PULMICORT) 0.5 MG/2ML nebulizer solution Mix 1 vial with 5-10 packs of Splenda and swallow mixture 2 times daily as directed (Patient taking differently: Mix 1 vial with 5-10 packs of Splenda and swallow mixture 1 time daily as directed) 120 mL 5  . Cholecalciferol (VITAMIN D3) 50 MCG (2000  UT) TABS Take 1 tablet by mouth daily.    . cyanocobalamin (,VITAMIN B-12,) 1000 MCG/ML injection Inject 1,000 mcg into the muscle every 30 (thirty) days.     Marland Kitchen dexlansoprazole (DEXILANT) 60 MG capsule Take 1 capsule (60 mg total) by mouth daily. 90 capsule 3  . docusate sodium (COLACE) 100 MG capsule Take 100 mg by mouth daily.     Marland Kitchen glucosamine-chondroitin 500-400 MG tablet Take 1 tablet by mouth at bedtime.    Marland Kitchen linaclotide (LINZESS) 72 MCG capsule Take 1 capsule (72 mcg total) by mouth daily before breakfast. 30 capsule 6  . MAGNESIUM PO Take 1 tablet by mouth daily.    . ondansetron (ZOFRAN) 8 MG tablet as needed.    . senna (SENOKOT) 8.6 MG tablet Take 1 tablet by mouth as needed for constipation.    . Turmeric (QC TUMERIC COMPLEX PO) Take 1 tablet by mouth daily.     Current Facility-Administered Medications  Medication Dose Route Frequency Provider Last Rate Last Admin  . 0.9 %  sodium chloride infusion  500 mL Intravenous Once Lynann Bologna, MD        Allergies  Allergen Reactions  . Arlice Colt Isothiocyanate]   . Onion   . Peanut-Containing Drug Products     And possibly all other nuts    Review of Systems:     Physical Exam:    BP 134/88   Pulse 86   Ht 5\' 7"  (1.702 m)   Wt 202 lb (91.6 kg)   BMI 31.64 kg/m  Filed Weights   02/24/20 1112  Weight: 202 lb (91.6 kg)   HEENT: No jaundice Respiratory: Bilateral clear CVS: S1-S2 normal no S3 or S4.  No murmurs. Abdo: Mild right upper quadrant and right lower quadrant abdominal tenderness without rebound.  Bowel sounds are present.  No definite hepatosplenomegaly. Extremities: No edema.    14/03/21, MD 02/24/2020, 11:32 AM  Cc: 14/05/2019, MD

## 2020-02-24 NOTE — Patient Instructions (Addendum)
If you are age 46 or older, your body mass index should be between 23-30. Your Body mass index is 31.64 kg/m. If this is out of the aforementioned range listed, please consider follow up with your Primary Care Provider.  If you are age 53 or younger, your body mass index should be between 19-25. Your Body mass index is 31.64 kg/m. If this is out of the aformentioned range listed, please consider follow up with your Primary Care Provider.    We have sent the following medications to your pharmacy for you to pick up at your convenience: Dexilant 60mg  Zofran 4mg  Linzess 145mg  daily samples given-7 bottles given  Continue doculax as needed  GI coctail  [place] [facility] information is below: Address: [address], [place], [facility] [address]  [phone]  *Please DO NOT go directly from our office to pick up this medication! Give the pharmacy 1 day to process the prescription as this is compounded and takes time to make.  Due to recent changes in healthcare laws, you may see the results of your imaging and laboratory studies on MyChart before your provider has had a chance to review them.  We understand that in some cases there may be results that are confusing or concerning to you. Not all laboratory results come back in the same time frame and the provider may be waiting for multiple results in order to interpret others.  Please give [place] 48 hours in order for your provider to thoroughly review all the results before contacting the office for clarification of your results.

## 2020-06-19 ENCOUNTER — Telehealth: Payer: Self-pay | Admitting: Gastroenterology

## 2020-06-19 ENCOUNTER — Other Ambulatory Visit: Payer: Self-pay

## 2020-06-19 MED ORDER — LINACLOTIDE 145 MCG PO CAPS: 145.0000 ug | ORAL_CAPSULE | Freq: Every day | ORAL | 5 refills | Status: DC

## 2020-06-19 NOTE — Telephone Encounter (Signed)
Done

## 2020-10-17 MED ORDER — LINACLOTIDE 145 MCG PO CAPS: 145.0000 ug | ORAL_CAPSULE | Freq: Every day | ORAL | 5 refills | Status: DC

## 2020-10-17 NOTE — Addendum Note (Signed)
Addended by: Alberteen Sam E on: 10/17/2020 02:34 PM   Modules accepted: Orders

## 2020-12-17 ENCOUNTER — Other Ambulatory Visit: Payer: Self-pay

## 2020-12-17 ENCOUNTER — Ambulatory Visit (INDEPENDENT_AMBULATORY_CARE_PROVIDER_SITE_OTHER): Payer: BC Managed Care – PPO | Admitting: Allergy and Immunology

## 2020-12-17 ENCOUNTER — Encounter: Payer: Self-pay | Admitting: Allergy and Immunology

## 2020-12-17 VITALS — BP 102/68 | HR 75 | Resp 21 | Ht 67.5 in | Wt 216.0 lb

## 2020-12-17 DIAGNOSIS — K219 Gastro-esophageal reflux disease without esophagitis: Secondary | ICD-10-CM | POA: Diagnosis not present

## 2020-12-17 DIAGNOSIS — J3089 Other allergic rhinitis: Secondary | ICD-10-CM | POA: Diagnosis not present

## 2020-12-17 DIAGNOSIS — K200 Eosinophilic esophagitis: Secondary | ICD-10-CM

## 2020-12-17 DIAGNOSIS — T781XXD Other adverse food reactions, not elsewhere classified, subsequent encounter: Secondary | ICD-10-CM

## 2020-12-17 MED ORDER — BUDESONIDE 0.5 MG/2ML IN SUSP: RESPIRATORY_TRACT | 5 refills | Status: AC

## 2020-12-17 MED ORDER — AUVI-Q 0.3 MG/0.3ML IJ SOAJ: INTRAMUSCULAR | 3 refills | Status: AC

## 2020-12-17 MED ORDER — DEXLANSOPRAZOLE 60 MG PO CPDR: 60.0000 mg | DELAYED_RELEASE_CAPSULE | Freq: Every day | ORAL | 11 refills | Status: DC

## 2020-12-17 NOTE — Patient Instructions (Addendum)
  1.  Continue to Treat and prevent inflammation of esophagus:   A.  Budesonide 0.5 amp slurry 1- 2 times per day  B.  Submit for dupilumab injections  2.  Continue to Treat and prevent reflux:   A.  Dexilant 60 mg once a day  3.  If needed:   A.  OTC antihistamine -Claritin/Zyrtec/Allegra  B.  Auvi-Q 0.3, benadryl, MD/ER evaluation for allergic reaction  4.  Return to clinic in 6 months or earlier if problem  5. Obtain fall flu vaccine

## 2020-12-17 NOTE — Progress Notes (Signed)
[place] - [place] - [place] - [place] - [place]   Follow-up Note  Referring Provider: Noni Saupe, MD Primary Provider: Noni Saupe, MD Date of Office Visit: 12/17/2020  Subjective:   Victoria Cruz (DOB: [birth year]) is a 45 y.o. female who [facility] in re-evaluation of the following:  HPI: Victoria Cruz returns to this clinic in evaluation of eosinophilic esophagitis, oral allergy syndrome, allergic rhinitis, and reflux.  Her last visit to this clinic was 19 December 2019.  She still has some problems with swallowing.  She still must cut up her food into tiny little pieces.  She occasionally has choking and coughing on her saliva about twice a month that last several minutes.  She continues to use swallowed budesonide and continues on Dexilant.  Her classic reflux symptoms appear to be under pretty good control with Dexilant.  She has had no problems with her nose.  She remains away from eating all peanuts and tree nuts.  Apparently she was infected with COVID in June 2022 and developed some type of papular dermatitis that lasted about 2 weeks.  She then subsequently developed what appeared to be a cutaneous infection affecting her right lower extremity that required prednisone and doxycycline which fortunately did respond to this therapy.  Allergies as of 12/17/2020       Reactions   Mustard [employer] Isothiocyanate]    Onion    Peanut-containing Drug Products    And possibly all other nuts     Medication List    AMBULATORY NON FORMULARY MEDICATION Medication Name: GI Cocktail ( equal parts of Maalox, Viscous Lidocaine, Bentyl) Take 5-10 mls every 6 hours as needed.   Auvi-Q 0.3 mg/0.3 mL Soaj injection Generic drug: EPINEPHrine Use as directed for life-threatening allergic reaction.   budesonide 0.5 MG/2ML nebulizer solution Commonly known as: PULMICORT Mix 1 vial with 5-10 packs of Splenda and swallow mixture  2 times daily as directed   cyanocobalamin 1000 MCG/ML injection Commonly known as: (VITAMIN B-12) Inject 1,000 mcg into the muscle every 30 (thirty) days.   Dexilant 60 MG capsule Generic drug: dexlansoprazole Take 1 capsule (60 mg total) by mouth daily.   docusate sodium 100 MG capsule Commonly known as: COLACE Take 100 mg by mouth daily.   glucosamine-chondroitin 500-400 MG tablet Take 1 tablet by mouth at bedtime.   linaclotide 145 MCG Caps capsule Commonly known as: LINZESS Take 1 capsule (145 mcg total) by mouth daily before breakfast.   MAGNESIUM PO Take 1 tablet by mouth daily.   ondansetron 4 MG tablet Commonly known as: ZOFRAN Take 1 tablet (4 mg total) by mouth every 8 (eight) hours as needed for nausea or vomiting.   QC TUMERIC COMPLEX PO Take 1 tablet by mouth daily.   senna 8.6 MG tablet Commonly known as: SENOKOT Take 1 tablet by mouth as needed for constipation.   Vitamin D3 50 MCG (2000 UT) Tabs Take 1 tablet by mouth daily.     Past Medical History:  Diagnosis Date   Acute bacterial bronchitis    Diverticulitis    Dysphagia    Eosinophilic esophagitis    GERD (gastroesophageal reflux disease)    Hyperlipidemia    no meds   Multiple gastric ulcers     Past Surgical History:  Procedure Laterality Date   APPENDECTOMY  08/21/2017   CHOLECYSTECTOMY     COLON RESECTION  10/2014   COLONOSCOPY  08/21/2014   Colonic  poylp status post polypectomy. Mild left colonic diverticulosis.    COLONOSCOPY  01/21/2018   Dr Georgiana Shore in [place]   ESOPHAGOGASTRODUODENOSCOPY  05/26/2017   Linear and circular furrows of the esophagus highly suggestive of eosinophilic esophagitis (biopsied). Mild esophageal stricture status post esophageal dilatation. Multiple gastric ulcers (biopsied)   LAPAROSCOPIC HYSTERECTOMY  08/20/2017   left ovaries    TONSILLECTOMY     UPPER GASTROINTESTINAL ENDOSCOPY      Review of systems negative except as noted in HPI / PMHx or  noted below:  Review of Systems  Constitutional: Negative.   HENT: Negative.    Eyes: Negative.   Respiratory: Negative.    Cardiovascular: Negative.   Gastrointestinal: Negative.   Genitourinary: Negative.   Musculoskeletal: Negative.   Skin: Negative.   Neurological: Negative.   Endo/Heme/Allergies: Negative.   Psychiatric/Behavioral: Negative.      Objective:   Vitals:   12/17/20 0842  BP: 102/68  Pulse: 75  Resp: (!) 21  SpO2: 98%   Height: 5' 7.5" (171.5 cm)  Weight: 216 lb (98 kg)   Physical Exam Constitutional:      Appearance: She is not diaphoretic.  HENT:     Head: Normocephalic.     Right Ear: Tympanic membrane, ear canal and external ear normal.     Left Ear: Tympanic membrane, ear canal and external ear normal.     Nose: Nose normal. No mucosal edema or rhinorrhea.     Mouth/Throat:     Pharynx: Uvula midline. No oropharyngeal exudate.  Eyes:     Conjunctiva/sclera: Conjunctivae normal.  Neck:     Thyroid: No thyromegaly.     Trachea: Trachea normal. No tracheal tenderness or tracheal deviation.  Cardiovascular:     Rate and Rhythm: Normal rate and regular rhythm.     Heart sounds: Normal heart sounds, S1 normal and S2 normal. No murmur heard. Pulmonary:     Effort: No respiratory distress.     Breath sounds: Normal breath sounds. No stridor. No wheezing or rales.  Lymphadenopathy:     Head:     Right side of head: No tonsillar adenopathy.     Left side of head: No tonsillar adenopathy.     Cervical: No cervical adenopathy.  Skin:    Findings: No erythema or rash.     Nails: There is no clubbing.  Neurological:     Mental Status: She is alert.    Diagnostics: none  Assessment and Plan:   1. Eosinophilic esophagitis   2. LPRD (laryngopharyngeal reflux disease)   3. Pollen-food allergy, subsequent encounter   4. Other allergic rhinitis     1.  Continue to Treat and prevent inflammation of esophagus:   A.  Budesonide 0.5 amp slurry  1- 2 times per day  B.  Submit for dupilumab injections  2.  Continue to Treat and prevent reflux:   A.  Dexilant 60 mg once a day  3.  If needed:   A.  OTC antihistamine -Claritin/Zyrtec/Allegra  B.  Auvi-Q 0.3, benadryl, MD/ER evaluation for allergic reaction  4.  Return to clinic in 6 months or earlier if problem  5. Obtain fall flu vaccine    Jakari appears to be doing pretty well regarding all of her atopic disease other than her eosinophilic esophagitis which is still an active issue even in the face of swallowed budesonide and treating reflux with a proton pump inhibitor we will apply for dupilumab administration.  She will continue on all other  therapy as noted above.  [name], MD Allergy / Immunology [facility]

## 2020-12-18 ENCOUNTER — Encounter: Payer: Self-pay | Admitting: Allergy and Immunology

## 2020-12-24 ENCOUNTER — Telehealth: Payer: Self-pay | Admitting: *Deleted

## 2020-12-24 ENCOUNTER — Telehealth: Payer: Self-pay | Admitting: Gastroenterology

## 2020-12-24 NOTE — Telephone Encounter (Signed)
Patient called states she was referred to see an allergist and he is wanting to start Dupixant patient seeking advise from Dr. Chales Abrahams first.

## 2020-12-24 NOTE — Telephone Encounter (Signed)
-----   Message from Jessica Priest, MD sent at 12/18/2020  7:35 AM EDT ----- Juanda Chance for EOE

## 2020-12-24 NOTE — Telephone Encounter (Signed)
Pt States that she was referred to an allergist and they are wanting her to start Dupixent and wants Dr. Chales Abrahams Advice and opinion., Pt states that she has been Dr. Chales Abrahams pt for many years and she trust his opinion. Please Advise

## 2020-12-24 NOTE — Telephone Encounter (Signed)
Pt given Dr. Chales Abrahams recommendations. Pt verbalized understanding with all questions answered.

## 2020-12-24 NOTE — Telephone Encounter (Signed)
I think Dupixent will help Worth a good try RG

## 2021-01-28 ENCOUNTER — Other Ambulatory Visit: Payer: Self-pay

## 2021-01-28 ENCOUNTER — Encounter: Payer: Self-pay | Admitting: Gastroenterology

## 2021-01-28 ENCOUNTER — Ambulatory Visit (INDEPENDENT_AMBULATORY_CARE_PROVIDER_SITE_OTHER): Payer: BC Managed Care – PPO | Admitting: Gastroenterology

## 2021-01-28 VITALS — BP 122/80 | HR 64 | Ht 67.5 in | Wt 212.0 lb

## 2021-01-28 DIAGNOSIS — R1011 Right upper quadrant pain: Secondary | ICD-10-CM

## 2021-01-28 DIAGNOSIS — K200 Eosinophilic esophagitis: Secondary | ICD-10-CM

## 2021-01-28 DIAGNOSIS — K581 Irritable bowel syndrome with constipation: Secondary | ICD-10-CM

## 2021-01-28 MED ORDER — LINACLOTIDE 145 MCG PO CAPS: 145.0000 ug | ORAL_CAPSULE | Freq: Every day | ORAL | 11 refills | Status: DC

## 2021-01-28 MED ORDER — ONDANSETRON HCL 4 MG PO TABS: 4.0000 mg | ORAL_TABLET | Freq: Three times a day (TID) | ORAL | 0 refills | Status: AC | PRN

## 2021-01-28 MED ORDER — DEXLANSOPRAZOLE 60 MG PO CPDR: 60.0000 mg | DELAYED_RELEASE_CAPSULE | Freq: Every day | ORAL | 11 refills | Status: DC

## 2021-01-28 NOTE — Progress Notes (Signed)
Chief Complaint: FU  Referring Provider:  Noni Saupe, MD      ASSESSMENT AND PLAN;   #1. Eso dysphagia. 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 Followed by Dr Lucie Leather-  Multiple allergies.  #2. Intermittent RUQ pain. H/O lap chole. Neg CT AP 03/2019 #3. IBS-C. Neg colon 12/2017.  Plan: - EGD with dil Dec 2022. - Continue dexilant 60mg  po qd, #30, 11 refills. - Awaiting Dupixent approval(Dr Kozlow) - Zofran 4mg  ODT Q8hrs #20 prn. - Linzess 145 mcg po qd #30, 11 refills - FU therafter.   Proceed with EGD. I have discussed the risks and benefits. The risks including rare risk of perforation, bleeding, missed UGI neoplasms, risks of anesthesia/sedation. Alternatives were given. Patient is aware and agrees to proceed. All the questions were answered. This will be scheduled in upcoming days. Consent forms were given for review.   HPI:    Victoria Cruz is a 45 y.o. female  For follow-up visit. (Husband had metaststic Colon Ca s/p partial liver resection at [facility]-doing much better)  C/O Having solid food dysphagia x 3 months, mid chest.  Willing to proceed with EGD.  Has seen Dr. Birdie Sons and Dupixent-in process of getting approved.  Denies having any significant abdominal pain.  She does have abdominal discomfort when she gets constipated.  Better with bowel movements.  Has done well with Linzess and wants prescription for the same.  Continues to have right upper quad abdominal pain which at times gets worse.  There is associated with nausea but no vomiting.  No jaundice dark urine or pale stools.  No nonsteroidals.  EGD did not reveal any etiology for abdominal pain.  Negative CT Abdo/pelvis with p.o. and IV contrast as above  Occasional constipation alternating with diarrhea.   No melena or hematochezia.  No weight loss.  Wt Readings from Last 3 Encounters:  01/28/21 212 lb (96.2 kg)  12/17/20 216 lb (98 kg)   02/24/20 202 lb (91.6 kg)   Past GI procedures: -Recent colonoscopy by Dr. 12/19/20 on 01/21/2018- neg. recommend repeating at the age 43. (report scanned).  Patient with H/O diverticulitis s/p sigmoid resection 10/2014  EGD 12/2018 - Esophageal mucosal changes secondary to eosinophilic esophagitis. Bx- EoE 40 eos/HPF. Dilated. - Gastritis. Bx - neg for HP - Duodenal erosions without bleeding.  EGD 08/2017 - Esophageal mucosal changes secondary to eosinophilic esophagitis. Biopsied. Dilated with 48Fr. - Mild Erosive gastritis. No ulcers. Biopsied. - Bx- SQUAMOUS MUCOSA WITH INCREASED INTRAEPITHELIAL EOSINOPHILS (>500/HIGH POWER FIELD) Past Medical History:  Diagnosis Date   Acute bacterial bronchitis    Diverticulitis    Dysphagia    Eosinophilic esophagitis    GERD (gastroesophageal reflux disease)    Hyperlipidemia    no meds   Multiple gastric ulcers     Past Surgical History:  Procedure Laterality Date   APPENDECTOMY  08/21/2017   CHOLECYSTECTOMY     COLON RESECTION  10/2014   COLONOSCOPY  08/21/2014   Colonic poylp status post polypectomy. Mild left colonic diverticulosis.    COLONOSCOPY  01/21/2018   Dr 08/23/2014 in [place]   ESOPHAGOGASTRODUODENOSCOPY  05/26/2017   Linear and circular furrows of the esophagus highly suggestive of eosinophilic esophagitis (biopsied). Mild esophageal stricture status post esophageal dilatation. Multiple gastric ulcers (biopsied)   LAPAROSCOPIC HYSTERECTOMY  08/20/2017   left ovaries    TONSILLECTOMY     UPPER GASTROINTESTINAL ENDOSCOPY  Family History  Problem Relation Age of Onset   Diabetes Mother    High blood pressure Mother    High blood pressure Father    Asthma Sister    Other Sister        EOE   Breast cancer Maternal Aunt    Cancer - Other Maternal Aunt        Oral cancer   Kidney cancer Maternal Uncle    Lung cancer Paternal Grandfather    Colon cancer Neg Hx    Esophageal cancer Neg Hx    Stomach cancer  Neg Hx    Rectal cancer Neg Hx    Colon polyps Neg Hx     Social History   Tobacco Use   Smoking status: Never   Smokeless tobacco: Never  Vaping Use   Vaping Use: Never used  Substance Use Topics   Alcohol use: Not Currently   Drug use: Never    Current Outpatient Medications  Medication Sig Dispense Refill   AMBULATORY NON FORMULARY MEDICATION Medication Name: GI Cocktail ( equal parts of Maalox, Viscous Lidocaine, Bentyl) Take 5-10 mls every 6 hours as needed. 120 mL 1   AUVI-Q 0.3 MG/0.3ML SOAJ injection Use as directed for life-threatening allergic reaction. 2 each 3   budesonide (PULMICORT) 0.5 MG/2ML nebulizer solution Mix 1 vial with 5-10 packs of Splenda and swallow mixture 2 times daily as directed 120 mL 5   Cholecalciferol (VITAMIN D3) 50 MCG (2000 UT) TABS Take 1 tablet by mouth daily.     cyanocobalamin (,VITAMIN B-12,) 1000 MCG/ML injection Inject 1,000 mcg into the muscle every 30 (thirty) days.      dexlansoprazole (DEXILANT) 60 MG capsule Take 1 capsule (60 mg total) by mouth daily. 30 capsule 11   docusate sodium (COLACE) 100 MG capsule Take 100 mg by mouth daily.     glucosamine-chondroitin 500-400 MG tablet Take 1 tablet by mouth at bedtime.     linaclotide (LINZESS) 145 MCG CAPS capsule Take 1 capsule (145 mcg total) by mouth daily before breakfast. 30 capsule 5   MAGNESIUM PO Take 1 tablet by mouth daily.     ondansetron (ZOFRAN) 4 MG tablet Take 1 tablet (4 mg total) by mouth every 8 (eight) hours as needed for nausea or vomiting. 20 tablet 0   senna (SENOKOT) 8.6 MG tablet Take 1 tablet by mouth as needed for constipation.     Turmeric (QC TUMERIC COMPLEX PO) Take 1 tablet by mouth daily.     Current Facility-Administered Medications  Medication Dose Route Frequency Provider Last Rate Last Admin   0.9 %  sodium chloride infusion  500 mL Intravenous Once Lynann Bologna, MD        Allergies  Allergen Reactions   Arlice Colt Isothiocyanate]    Onion     Peanut-Containing Drug Products     And possibly all other nuts    Review of Systems:     Physical Exam:    BP 122/80   Pulse 64   Ht 5' 7.5" (1.715 m)   Wt 212 lb (96.2 kg)   SpO2 98%   BMI 32.71 kg/m  Filed Weights   01/28/21 0833  Weight: 212 lb (96.2 kg)   HEENT: No jaundice Respiratory: Bilateral clear CVS: S1-S2 normal no S3 or S4.  No murmurs. Abdo: Mild right upper quadrant and right lower quadrant abdominal tenderness without rebound.  Bowel sounds are present.  No definite hepatosplenomegaly. Extremities: No edema.    Edman Circle,  MD 01/28/2021, 8:48 AM  Cc: Noni Saupe, MD

## 2021-01-28 NOTE — Patient Instructions (Signed)
If you are age 47 or older, your body mass index should be between 23-30. Your Body mass index is 32.71 kg/m. If this is out of the aforementioned range listed, please consider follow up with your Primary Care Provider.  If you are age 4 or younger, your body mass index should be between 19-25. Your Body mass index is 32.71 kg/m. If this is out of the aformentioned range listed, please consider follow up with your Primary Care Provider.   __________________________________________________________  The [facility] providers would like to encourage you to use [facility] to communicate with providers for non-urgent requests or questions.  Due to long hold times on the telephone, sending your provider a message by [facility] may be a faster and more efficient way to get a response.  Please allow 48 business hours for a response.  Please remember that this is for non-urgent requests.  ____________________________________________________________  Continue taking Ducolax as needed  Refills sent to your pharmacy for the following medications:  Dexilant 60mg  daily  Zofran 4mg  every 8 hours as needed 1Linzess 145 mcg daily  It was a pleasure to see you today!  , M.D.

## 2021-02-21 ENCOUNTER — Encounter: Payer: Self-pay | Admitting: Gastroenterology

## 2021-03-07 ENCOUNTER — Encounter: Payer: Self-pay | Admitting: Gastroenterology

## 2021-03-07 ENCOUNTER — Ambulatory Visit (AMBULATORY_SURGERY_CENTER): Payer: BC Managed Care – PPO | Admitting: Gastroenterology

## 2021-03-07 ENCOUNTER — Other Ambulatory Visit: Payer: Self-pay

## 2021-03-07 VITALS — BP 110/66 | HR 82 | Temp 96.6°F | Resp 16 | Ht 67.0 in | Wt 212.0 lb

## 2021-03-07 DIAGNOSIS — K2100 Gastro-esophageal reflux disease with esophagitis, without bleeding: Secondary | ICD-10-CM

## 2021-03-07 DIAGNOSIS — K317 Polyp of stomach and duodenum: Secondary | ICD-10-CM | POA: Diagnosis not present

## 2021-03-07 DIAGNOSIS — K2970 Gastritis, unspecified, without bleeding: Secondary | ICD-10-CM | POA: Diagnosis not present

## 2021-03-07 DIAGNOSIS — K219 Gastro-esophageal reflux disease without esophagitis: Secondary | ICD-10-CM | POA: Diagnosis not present

## 2021-03-07 DIAGNOSIS — R1310 Dysphagia, unspecified: Secondary | ICD-10-CM

## 2021-03-07 DIAGNOSIS — K449 Diaphragmatic hernia without obstruction or gangrene: Secondary | ICD-10-CM

## 2021-03-07 DIAGNOSIS — K200 Eosinophilic esophagitis: Secondary | ICD-10-CM

## 2021-03-07 MED ORDER — SODIUM CHLORIDE 0.9 % IV SOLN: 500.0000 mL | Freq: Once | INTRAVENOUS | Status: DC

## 2021-03-07 NOTE — Progress Notes (Signed)
Chief Complaint: FU  Referring Provider:  Noni Saupe, MD      ASSESSMENT AND PLAN;   #1. Eso dysphagia. 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 Followed by Dr Lucie Leather-  Multiple allergies.  #2. Intermittent RUQ pain. H/O lap chole. Neg CT AP 03/2019 #3. IBS-C. Neg colon 12/2017.  Plan: - EGD with dil Dec 2022. - Continue dexilant 60mg  po qd, #30, 11 refills. - Awaiting Dupixent approval(Dr Kozlow) - Zofran 4mg  ODT Q8hrs #20 prn. - Linzess 145 mcg po qd #30, 11 refills - FU therafter.   Proceed with EGD. I have discussed the risks and benefits. The risks including rare risk of perforation, bleeding, missed UGI neoplasms, risks of anesthesia/sedation. Alternatives were given. Patient is aware and agrees to proceed. All the questions were answered. This will be scheduled in upcoming days. Consent forms were given for review.   HPI:    Victoria Cruz is a 45 y.o. female  For follow-up visit. (Husband had metaststic Colon Ca s/p partial liver resection at [facility]-doing much better)  C/O Having solid food dysphagia x 3 months, mid chest.  Willing to proceed with EGD.  Has seen Dr. Birdie Sons and Dupixent-in process of getting approved.  Denies having any significant abdominal pain.  She does have abdominal discomfort when she gets constipated.  Better with bowel movements.  Has done well with Linzess and wants prescription for the same.  Continues to have right upper quad abdominal pain which at times gets worse.  There is associated with nausea but no vomiting.  No jaundice dark urine or pale stools.  No nonsteroidals.  EGD did not reveal any etiology for abdominal pain.  Negative CT Abdo/pelvis with p.o. and IV contrast as above  Occasional constipation alternating with diarrhea.   No melena or hematochezia.  No weight loss.  Wt Readings from Last 3 Encounters:  03/07/21 212 lb (96.2 kg)  01/28/21 212 lb (96.2 kg)   12/17/20 216 lb (98 kg)   Past GI procedures: -Recent colonoscopy by Dr. 13/07/22 on 01/21/2018- neg. recommend repeating at the age 70. (report scanned).  Patient with H/O diverticulitis s/p sigmoid resection 10/2014  EGD 12/2018 - Esophageal mucosal changes secondary to eosinophilic esophagitis. Bx- EoE 40 eos/HPF. Dilated. - Gastritis. Bx - neg for HP - Duodenal erosions without bleeding.  EGD 08/2017 - Esophageal mucosal changes secondary to eosinophilic esophagitis. Biopsied. Dilated with 48Fr. - Mild Erosive gastritis. No ulcers. Biopsied. - Bx- SQUAMOUS MUCOSA WITH INCREASED INTRAEPITHELIAL EOSINOPHILS (>500/HIGH POWER FIELD) Past Medical History:  Diagnosis Date   Acute bacterial bronchitis    Diverticulitis    Dysphagia    Eosinophilic esophagitis    GERD (gastroesophageal reflux disease)    Hyperlipidemia    no meds   Multiple gastric ulcers     Past Surgical History:  Procedure Laterality Date   APPENDECTOMY  08/21/2017   CHOLECYSTECTOMY     COLON RESECTION  10/2014   COLONOSCOPY  08/21/2014   Colonic poylp status post polypectomy. Mild left colonic diverticulosis.    COLONOSCOPY  01/21/2018   Dr 08/23/2014 in [employer]   ESOPHAGOGASTRODUODENOSCOPY  05/26/2017   Linear and circular furrows of the esophagus highly suggestive of eosinophilic esophagitis (biopsied). Mild esophageal stricture status post esophageal dilatation. Multiple gastric ulcers (biopsied)   LAPAROSCOPIC HYSTERECTOMY  08/20/2017   left ovaries    TONSILLECTOMY     UPPER GASTROINTESTINAL ENDOSCOPY  Family History  Problem Relation Age of Onset   Diabetes Mother    High blood pressure Mother    High blood pressure Father    Asthma Sister    Other Sister        EOE   Breast cancer Maternal Aunt    Cancer - Other Maternal Aunt        Oral cancer   Kidney cancer Maternal Uncle    Lung cancer Paternal Grandfather    Colon cancer Neg Hx    Esophageal cancer Neg Hx    Stomach cancer Neg  Hx    Rectal cancer Neg Hx    Colon polyps Neg Hx     Social History   Tobacco Use   Smoking status: Never   Smokeless tobacco: Never  Vaping Use   Vaping Use: Never used  Substance Use Topics   Alcohol use: Not Currently   Drug use: Never    Current Outpatient Medications  Medication Sig Dispense Refill   budesonide (PULMICORT) 0.5 MG/2ML nebulizer solution Mix 1 vial with 5-10 packs of Splenda and swallow mixture 2 times daily as directed 120 mL 5   glucosamine-chondroitin 500-400 MG tablet Take 1 tablet by mouth at bedtime.     linaclotide (LINZESS) 145 MCG CAPS capsule Take 1 capsule (145 mcg total) by mouth daily before breakfast. 30 capsule 11   ondansetron (ZOFRAN) 4 MG tablet Take 1 tablet (4 mg total) by mouth every 8 (eight) hours as needed for nausea or vomiting. 20 tablet 0   Turmeric (QC TUMERIC COMPLEX PO) Take 1 tablet by mouth daily.     AMBULATORY NON FORMULARY MEDICATION Medication Name: GI Cocktail ( equal parts of Maalox, Viscous Lidocaine, Bentyl) Take 5-10 mls every 6 hours as needed. 120 mL 1   AUVI-Q 0.3 MG/0.3ML SOAJ injection Use as directed for life-threatening allergic reaction. 2 each 3   Cholecalciferol (VITAMIN D3) 50 MCG (2000 UT) TABS Take 1 tablet by mouth daily.     cyanocobalamin (,VITAMIN B-12,) 1000 MCG/ML injection Inject 1,000 mcg into the muscle every 30 (thirty) days.      dexlansoprazole (DEXILANT) 60 MG capsule Take 1 capsule (60 mg total) by mouth daily. 30 capsule 11   docusate sodium (COLACE) 100 MG capsule Take 100 mg by mouth daily.     MAGNESIUM PO Take 1 tablet by mouth daily.     senna (SENOKOT) 8.6 MG tablet Take 1 tablet by mouth as needed for constipation.     Current Facility-Administered Medications  Medication Dose Route Frequency Provider Last Rate Last Admin   0.9 %  sodium chloride infusion  500 mL Intravenous Once Lynann Bologna, MD       0.9 %  sodium chloride infusion  500 mL Intravenous Once Lynann Bologna, MD         Allergies  Allergen Reactions   Arlice Colt Isothiocyanate]    Onion    Peanut-Containing Drug Products     And possibly all other nuts    Review of Systems:     Physical Exam:    BP 121/71    Pulse 70    Temp (!) 96.6 F (35.9 C)    Ht 5\' 7"  (1.702 m)    Wt 212 lb (96.2 kg)    SpO2 97%    BMI 33.20 kg/m  Filed Weights   03/07/21 0907  Weight: 212 lb (96.2 kg)   HEENT: No jaundice Respiratory: Bilateral clear CVS: S1-S2 normal no S3 or  S4.  No murmurs. Abdo: Mild right upper quadrant and right lower quadrant abdominal tenderness without rebound.  Bowel sounds are present.  No definite hepatosplenomegaly. Extremities: No edema.    Edman Circle, MD 03/07/2021, 9:16 AM  Cc: Noni Saupe, MD

## 2021-03-07 NOTE — Progress Notes (Signed)
Report to PACU, RN, vss, BBS= Clear.  

## 2021-03-07 NOTE — Progress Notes (Signed)
Called to room to assist during endoscopic procedure.  Patient ID and intended procedure confirmed with present staff. Received instructions for my participation in the procedure from the performing physician.  

## 2021-03-07 NOTE — Patient Instructions (Addendum)
Please read handouts provided. Continue present medications, including Dexilant. No aspirin, ibuprofen, naproxen, or other non-steriodal anti-inflammatory drugs. Await pathology results. Avoid allergens as suggested by Dr. Lucie Leather. Follow Post Dilation Diet.    YOU HAD AN ENDOSCOPIC PROCEDURE TODAY AT THE [facility]:   Refer to the procedure report that was given to you for any specific questions about what was found during the examination.  If the procedure report does not answer your questions, please call your gastroenterologist to clarify.  If you requested that your care partner not be given the details of your procedure findings, then the procedure report has been included in a sealed envelope for you to review at your convenience later.  YOU SHOULD EXPECT: Some feelings of bloating in the abdomen. Passage of more gas than usual.  Walking can help get rid of the air that was put into your GI tract during the procedure and reduce the bloating. If you had a lower endoscopy (such as a colonoscopy or flexible sigmoidoscopy) you may notice spotting of blood in your stool or on the toilet paper. If you underwent a bowel prep for your procedure, you may not have a normal bowel movement for a few days.  Please Note:  You might notice some irritation and congestion in your nose or some drainage.  This is from the oxygen used during your procedure.  There is no need for concern and it should clear up in a day or so.  SYMPTOMS TO REPORT IMMEDIATELY:   Following upper endoscopy (EGD)  Vomiting of blood or coffee ground material  New chest pain or pain under the shoulder blades  Painful or persistently difficult swallowing  New shortness of breath  Fever of 100F or higher  Black, tarry-looking stools  For urgent or emergent issues, a gastroenterologist can be reached at any hour by calling [phone]. Do not use MyChart messaging for urgent concerns.    DIET:   Drink plenty  of fluids but you should avoid alcoholic beverages for 24 hours.  ACTIVITY:  You should plan to take it easy for the rest of today and you should NOT DRIVE or use heavy machinery until tomorrow (because of the sedation medicines used during the test).    FOLLOW UP: Our staff will call the number listed on your records 48-72 hours following your procedure to check on you and address any questions or concerns that you may have regarding the information given to you following your procedure. If we do not reach you, we will leave a message.  We will attempt to reach you two times.  During this call, we will ask if you have developed any symptoms of COVID 19. If you develop any symptoms (ie: fever, flu-like symptoms, shortness of breath, cough etc.) before then, please call [phone].  If you test positive for Covid 19 in the 2 weeks post procedure, please call and report this information to [place].    If any biopsies were taken you will be contacted by phone or by letter within the next 1-3 weeks.  Please call us at [phone] if you have not heard about the biopsies in 3 weeks.    SIGNATURES/CONFIDENTIALITY: You and/or your care partner have signed paperwork which will be entered into your electronic medical record.  These signatures attest to the fact that that the information above on your After Visit Summary has been reviewed and is understood.  Full responsibility of the confidentiality of this discharge information lies with  you and/or your care-partner.  °

## 2021-03-07 NOTE — Progress Notes (Signed)
Pt's states no medical or surgical changes since previsit or office visit. Vs by CW. 

## 2021-03-07 NOTE — Op Note (Signed)
[facility] Patient Name: Nakima Fluegge Procedure Date: 03/07/2021 9:09 AM MRN: [id number] Endoscopist: Lynann Bologna , MD Age: 45 Referring MD:  Date of Birth: 04-22-1975 Gender: Female Account #: [id number] Procedure:                Upper GI endoscopy Indications:              #1. Eso dysphagia. H/O EoE with eso stricture s/p                            dil 50Fr 12/2018 (Eso Bx: 2020 40 eos/HPF, down                            from >500/HPF 2019). Followed by Dr Lucie Leather-                            Multiple allergies.                           #2. Intermittent RUQ pain. H/O lap chole. Neg CT AP                            03/2019 Medicines:                Monitored Anesthesia Care Procedure:                Pre-Anesthesia Assessment:                           - Prior to the procedure, a History and Physical                            was performed, and patient medications and                            allergies were reviewed. The patient's tolerance of                            previous anesthesia was also reviewed. The risks                            and benefits of the procedure and the sedation                            options and risks were discussed with the patient.                            All questions were answered, and informed consent                            was obtained. Prior Anticoagulants: The patient has                            taken no previous anticoagulant or antiplatelet  agents. ASA Grade Assessment: II - A patient with                            mild systemic disease. After reviewing the risks                            and benefits, the patient was deemed in                            satisfactory condition to undergo the procedure.                           After obtaining informed consent, the endoscope was                            passed under direct vision. Throughout the                            procedure, the  patient's blood pressure, pulse, and                            oxygen saturations were monitored continuously. The                            Endoscope was introduced through the mouth, and                            advanced to the second part of duodenum. The upper                            GI endoscopy was accomplished without difficulty.                            The patient tolerated the procedure well. Scope In: Scope Out: Findings:                 Mucosal changes including feline appearance and                            longitudinal furrows were found in the upper third                            of the esophagus and in the middle third of the                            esophagus without any strictures. Biopsies were                            obtained from the proximal and distal esophagus                            with cold forceps for histology of suspected  eosinophilic esophagitis. The scope was withdrawn.                            Dilation was performed with a Maloney dilator with                            mild resistance at 48 Fr and 50 Fr.                           LA Grade A (one or more mucosal breaks less than 5                            mm, not extending between tops of 2 mucosal folds)                            esophagitis with no bleeding was found 36 cm from                            the incisors at GE junction, examined by NBI.                            Biopsies were taken with a cold forceps for                            histology.                           A small transient hiatal hernia was present.                           Localized mild inflammation characterized by                            erythema was found in the gastric body and in the                            gastric antrum. Biopsies were taken with a cold                            forceps for histology.                           A single 6 mm sessile polyp with  no bleeding and no                            stigmata of recent bleeding was found in the                            gastric body. The polyp was removed with a cold                            snare. Resection and retrieval were complete.  Patchy mildly erythematous mucosa without active                            bleeding and with no stigmata of bleeding was found                            in the duodenal bulb. Biopsies were taken with a                            cold forceps for histology. Complications:            No immediate complications. Estimated Blood Loss:     Estimated blood loss: none. Impression:               - Esophageal mucosal changes secondary to                            eosinophilic esophagitis. Biopsied. Dilated.                           - LA Grade A reflux esophagitis with no bleeding.                            Biopsied.                           - Small hiatal hernia.                           - Gastritis. Biopsied.                           - A single gastric polyp. Resected and retrieved.                           - Erythematous duodenopathy. Biopsied. Recommendation:           - Patient has a contact number available for                            emergencies. The signs and symptoms of potential                            delayed complications were discussed with the                            patient. Return to normal activities tomorrow.                            Written discharge instructions were provided to the                            patient.                           - Post dilatation diet.                           -  Continue present medications including Dexilant                            60 mg p.o. QD #90, 4 refills.                           - Await pathology results.                           - No aspirin, ibuprofen, naproxen, or other                            non-steroidal anti-inflammatory drugs.                            - Avoid allergens as suggested by Dr. Lucie Leather                           - The findings and recommendations were discussed                            with the patient's family. Lynann Bologna, MD 03/07/2021 9:50:22 AM This report has been signed electronically.

## 2021-03-08 ENCOUNTER — Other Ambulatory Visit: Payer: Self-pay

## 2021-03-08 DIAGNOSIS — K200 Eosinophilic esophagitis: Secondary | ICD-10-CM

## 2021-03-08 MED ORDER — DEXLANSOPRAZOLE 60 MG PO CPDR: 60.0000 mg | DELAYED_RELEASE_CAPSULE | Freq: Every day | ORAL | 4 refills | Status: DC

## 2021-03-11 ENCOUNTER — Telehealth: Payer: Self-pay

## 2021-03-11 NOTE — Telephone Encounter (Signed)
°  Follow up Call-  Call back number 03/07/2021 12/24/2018  Post procedure Call Back phone  # [phone]  Permission to leave phone message Yes Yes  Some recent data might be hidden     Patient questions:  Do you have a fever, pain , or abdominal swelling? No. Pain Score  0 *  Have you tolerated food without any problems? Yes.    Have you been able to return to your normal activities? Yes.    Do you have any questions about your discharge instructions: Diet   No. Medications  No. Follow up visit  No.  Do you have questions or concerns about your Care? No.  Actions: * If pain score is 4 or above: No action needed, pain <4.

## 2021-03-12 ENCOUNTER — Encounter: Payer: Self-pay | Admitting: Gastroenterology

## 2021-03-26 ENCOUNTER — Telehealth: Payer: Self-pay | Admitting: Gastroenterology

## 2021-03-26 ENCOUNTER — Other Ambulatory Visit: Payer: Self-pay

## 2021-03-26 DIAGNOSIS — R1011 Right upper quadrant pain: Secondary | ICD-10-CM

## 2021-03-26 MED ORDER — DICYCLOMINE HCL 20 MG PO TABS: 20.0000 mg | ORAL_TABLET | Freq: Four times a day (QID) | ORAL | 0 refills | Status: AC | PRN

## 2021-03-26 NOTE — Telephone Encounter (Signed)
If she has not been having bowel movements most days ondansetron may be the cause and she could hold that.  We can Rx dicyclomine 20 mg every 6 hrs prn # 30 no refill  If she fails to get better or worsens call back or if severe sxs go to ED

## 2021-03-26 NOTE — Telephone Encounter (Signed)
Dr. Chales Abrahams Pt. Pt notified of results from recent EGD Pt states that she has had Bloating and mid Abdominal pain for the last week and a half and that has increased in the last two days. Pt states last BM was  last night. Taking Zofran for nausea. Pt rates abdominal pain 5 out out 10 on scale. Please advise as DOD

## 2021-03-26 NOTE — Telephone Encounter (Signed)
Inbound call from patient requesting a call back about results from EGD. Patient also states she have been experiencing swollen stomach and stomach pains.

## 2021-03-26 NOTE — Telephone Encounter (Signed)
Pt notified of Dr. Leone Payor recommendations.  Prescription sent to pharmacy. Pt made aware. Pt verbalized understanding with all questions answered.

## 2021-03-27 ENCOUNTER — Telehealth: Payer: Self-pay | Admitting: Gastroenterology

## 2021-03-27 NOTE — Telephone Encounter (Signed)
Patient recently had EGD with Dr. Chales Abrahams.  She complains of increased belching, increased swelling/bloating (says she looks pregnant) and wants to make sure nothing else is going on with her.  She took her Linzess today and still has not had a bowel movement where usually she would have had 2-3 BM's after taking it.  She has an appointment scheduled with Dr. Chales Abrahams 1/24 at 3:00 p.m., but feels she needs to be seen sooner or at least told what she should be doing between now and then.  Please call patient and advise.  Thank you.

## 2021-03-28 NOTE — Telephone Encounter (Signed)
Left Message for pt to call back  °

## 2021-03-29 NOTE — Telephone Encounter (Signed)
Left Message for pt to call back  °

## 2021-04-01 NOTE — Telephone Encounter (Signed)
Pt States that she is feeling Better; Had a BM yesterday: The swelling and Bloating have decreased: Pt reminded of Office Visit with Dr. Chales Abrahams on 04/16/2021 at 3:00: Pt verbalized understanding with all questions answered.

## 2021-04-16 ENCOUNTER — Encounter: Payer: Self-pay | Admitting: Gastroenterology

## 2021-04-16 ENCOUNTER — Other Ambulatory Visit: Payer: Self-pay

## 2021-04-16 ENCOUNTER — Ambulatory Visit: Payer: BC Managed Care – PPO | Admitting: Gastroenterology

## 2021-04-16 VITALS — BP 126/70 | HR 76 | Ht 67.0 in | Wt 220.0 lb

## 2021-04-16 DIAGNOSIS — K581 Irritable bowel syndrome with constipation: Secondary | ICD-10-CM | POA: Diagnosis not present

## 2021-04-16 DIAGNOSIS — K200 Eosinophilic esophagitis: Secondary | ICD-10-CM

## 2021-04-16 MED ORDER — DEXLANSOPRAZOLE 60 MG PO CPDR: 60.0000 mg | DELAYED_RELEASE_CAPSULE | Freq: Every day | ORAL | 4 refills | Status: DC

## 2021-04-16 MED ORDER — LINACLOTIDE 145 MCG PO CAPS: 145.0000 ug | ORAL_CAPSULE | Freq: Every day | ORAL | 11 refills | Status: DC

## 2021-04-16 NOTE — Patient Instructions (Addendum)
If you are age 48 or older, your body mass index should be between 23-30. Your Body mass index is 34.46 kg/m. If this is out of the aforementioned range listed, please consider follow up with your Primary Care Provider.  If you are age 76 or younger, your body mass index should be between 19-25. Your Body mass index is 34.46 kg/m. If this is out of the aformentioned range listed, please consider follow up with your Primary Care Provider.   ________________________________________________________  The [facility] providers would like to encourage you to use [facility] to communicate with providers for non-urgent requests or questions.  Due to long hold times on the telephone, sending your provider a message by [facility] may be a faster and more efficient way to get a response.  Please allow 48 business hours for a response.  Please remember that this is for non-urgent requests.  _______________________________________________________  If constipated add miralax 17g daily until good bowel moment and then continue as needed.  We have sent the following medications to your pharmacy for you to pick up at your convenience: Dexilant Linzess  Please call with any questions or concerns.  Thank you,  Dr. Jackquline Denmark

## 2021-04-16 NOTE — Progress Notes (Signed)
Chief Complaint: FU  Referring Provider:  Noni Saupe, MD      ASSESSMENT AND PLAN;   #1. 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 (Eso bx: <5 Eos/HPF. Eso Bx: 2020 40 eos/HPF, down from >500/HPF 2019). Followed by Dr Lucie Leather-  Multiple allergies.  #2. Intermittent RUQ pain. H/O lap chole. Neg CT AP 03/2019 #3. IBS-C. Neg colon 12/2017.  Plan: - Continue dexilant 60mg  po qd, #30, 11 refills. Pt assistance - Linzess 145 mcg po qd #30, 11 refills - Miralax 17g po QD as needed for constipation. - FU therafter. - If with any new problems, CT AP with p.o. and IV contrast.  Since she feels better, would like to hold off. - Recommend screening colon at age 37.  Certainly earlier if she has any new problems.   HPI:    Victoria Cruz is a 46 y.o. female  For follow-up visit. (Husband had metaststic Colon Ca s/p partial liver resection at [facility]-doing much better)  C/O got severely constipated with associated abdominal bloating.  Then took Linzess and it has gotten gradually better.  Today she does feel better.  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  She does admit that she has been under stress.  No further dysphagia  Last EGD showed near complete resolution of eosinophils as below.  She has not started Dupixent (d/t cost)-wants to hold off.  No melena or hematochezia.  No weight loss.  Wt Readings from Last 3 Encounters:  04/16/21 220 lb (99.8 kg)  03/07/21 212 lb (96.2 kg)  01/28/21 212 lb (96.2 kg)   Past GI procedures: - Colonoscopy by Dr. 13/07/22 on 01/21/2018- neg. recommend repeating at the age 33. (report scanned).  Patient with H/O diverticulitis s/p sigmoid resection 10/2014  EGD 03/07/2021 - Esophageal mucosal changes secondary to eosinophilic esophagitis. Bx - <5 Eos HPF. Dilated. - LA Grade A reflux esophagitis with no bleeding. Biopsied. - Small hiatal hernia. - Gastritis. Bx- neg for HP - A single  gastric polyp. Resected and retrieved. Bx-fundic gland polyp - Erythematous duodenopathy. Bx- neg for celiac  EGD 12/2018 - Esophageal mucosal changes secondary to eosinophilic esophagitis. Bx- EoE 40 eos/HPF. Dilated. - Gastritis. Bx - neg for HP - Duodenal erosions without bleeding.  EGD 08/2017 - Esophageal mucosal changes secondary to eosinophilic esophagitis. Biopsied. Dilated with 48Fr. - Mild Erosive gastritis. No ulcers. Biopsied. - Bx- SQUAMOUS MUCOSA WITH INCREASED INTRAEPITHELIAL EOSINOPHILS (>500/HIGH POWER FIELD) Past Medical History:  Diagnosis Date   Acute bacterial bronchitis    Diverticulitis    Dysphagia    Eosinophilic esophagitis    GERD (gastroesophageal reflux disease)    Hyperlipidemia    no meds   Multiple gastric ulcers     Past Surgical History:  Procedure Laterality Date   APPENDECTOMY  08/21/2017   CHOLECYSTECTOMY     COLON RESECTION  10/2014   COLONOSCOPY  08/21/2014   Colonic poylp status post polypectomy. Mild left colonic diverticulosis.    COLONOSCOPY  01/21/2018   Dr 01/23/2018 in [place]   ESOPHAGOGASTRODUODENOSCOPY  05/26/2017   Linear and circular furrows of the esophagus highly suggestive of eosinophilic esophagitis (biopsied). Mild esophageal stricture status post esophageal dilatation. Multiple gastric ulcers (biopsied)   LAPAROSCOPIC HYSTERECTOMY  08/20/2017   left ovaries    TONSILLECTOMY     UPPER GASTROINTESTINAL ENDOSCOPY      Family History  Problem Relation Age of Onset   Diabetes Mother  High blood pressure Mother    High blood pressure Father    Asthma Sister    Other Sister        EOE   Breast cancer Maternal Aunt    Cancer - Other Maternal Aunt        Oral cancer   Kidney cancer Maternal Uncle    Lung cancer Paternal Grandfather    Colon cancer Neg Hx    Esophageal cancer Neg Hx    Stomach cancer Neg Hx    Rectal cancer Neg Hx    Colon polyps Neg Hx     Social History   Tobacco Use   Smoking status:  Never   Smokeless tobacco: Never  Vaping Use   Vaping Use: Never used  Substance Use Topics   Alcohol use: Not Currently   Drug use: Never    Current Outpatient Medications  Medication Sig Dispense Refill   AMBULATORY NON FORMULARY MEDICATION Medication Name: GI Cocktail ( equal parts of Maalox, Viscous Lidocaine, Bentyl) Take 5-10 mls every 6 hours as needed. 120 mL 1   AUVI-Q 0.3 MG/0.3ML SOAJ injection Use as directed for life-threatening allergic reaction. 2 each 3   budesonide (PULMICORT) 0.5 MG/2ML nebulizer solution Mix 1 vial with 5-10 packs of Splenda and swallow mixture 2 times daily as directed 120 mL 5   Cholecalciferol (VITAMIN D3) 50 MCG (2000 UT) TABS Take 1 tablet by mouth daily.     cyanocobalamin (,VITAMIN B-12,) 1000 MCG/ML injection Inject 1,000 mcg into the muscle every 30 (thirty) days.      dexlansoprazole (DEXILANT) 60 MG capsule Take 1 capsule (60 mg total) by mouth daily. 90 capsule 4   dicyclomine (BENTYL) 20 MG tablet Take 1 tablet (20 mg total) by mouth every 6 (six) hours as needed for spasms. 30 tablet 0   docusate sodium (COLACE) 100 MG capsule Take 100 mg by mouth daily.     glucosamine-chondroitin 500-400 MG tablet Take 1 tablet by mouth at bedtime.     linaclotide (LINZESS) 145 MCG CAPS capsule Take 1 capsule (145 mcg total) by mouth daily before breakfast. 30 capsule 11   MAGNESIUM PO Take 1 tablet by mouth daily.     methylPREDNISolone (MEDROL DOSEPAK) 4 MG TBPK tablet See admin instructions.     ondansetron (ZOFRAN) 4 MG tablet Take 1 tablet (4 mg total) by mouth every 8 (eight) hours as needed for nausea or vomiting. 20 tablet 0   senna (SENOKOT) 8.6 MG tablet Take 1 tablet by mouth as needed for constipation.     Turmeric (QC TUMERIC COMPLEX PO) Take 1 tablet by mouth daily.     Current Facility-Administered Medications  Medication Dose Route Frequency Provider Last Rate Last Admin   0.9 %  sodium chloride infusion  500 mL Intravenous Once Lynann BolognaGupta,  Tahj Lindseth, MD        Allergies  Allergen Reactions   Arlice ColtMustard [Allyl Isothiocyanate]    Onion    Peanut-Containing Drug Products     And possibly all other nuts    Review of Systems:     Physical Exam:    BP 126/70    Pulse 76    Ht 5\' 7"  (1.702 m)    Wt 220 lb (99.8 kg)    SpO2 98%    BMI 34.46 kg/m  Filed Weights   04/16/21 1454  Weight: 220 lb (99.8 kg)   HEENT: No jaundice Respiratory: Bilateral clear CVS: S1-S2 normal no S3 or S4.  No murmurs.  Abdo: Mild right upper quadrant and right lower quadrant abdominal tenderness without rebound.  Bowel sounds are present.  No definite hepatosplenomegaly. Extremities: No edema.    Edman Circle, MD 04/16/2021, 3:06 PM  Cc: Noni Saupe, MD

## 2021-10-10 DIAGNOSIS — E669 Obesity, unspecified: Secondary | ICD-10-CM | POA: Diagnosis not present

## 2021-10-10 DIAGNOSIS — M25562 Pain in left knee: Secondary | ICD-10-CM | POA: Diagnosis not present

## 2021-10-10 DIAGNOSIS — Z6832 Body mass index (BMI) 32.0-32.9, adult: Secondary | ICD-10-CM | POA: Diagnosis not present

## 2022-01-14 DIAGNOSIS — K581 Irritable bowel syndrome with constipation: Secondary | ICD-10-CM | POA: Diagnosis not present

## 2022-01-14 DIAGNOSIS — Z6832 Body mass index (BMI) 32.0-32.9, adult: Secondary | ICD-10-CM | POA: Diagnosis not present

## 2022-01-14 DIAGNOSIS — R1011 Right upper quadrant pain: Secondary | ICD-10-CM | POA: Diagnosis not present

## 2022-03-19 DIAGNOSIS — J069 Acute upper respiratory infection, unspecified: Secondary | ICD-10-CM | POA: Diagnosis not present

## 2022-03-27 ENCOUNTER — Other Ambulatory Visit: Payer: Self-pay | Admitting: Allergy and Immunology

## 2022-05-13 ENCOUNTER — Telehealth: Payer: Self-pay | Admitting: Gastroenterology

## 2022-05-13 DIAGNOSIS — K581 Irritable bowel syndrome with constipation: Secondary | ICD-10-CM

## 2022-05-13 DIAGNOSIS — K200 Eosinophilic esophagitis: Secondary | ICD-10-CM

## 2022-05-13 MED ORDER — LINACLOTIDE 145 MCG PO CAPS: 145.0000 ug | ORAL_CAPSULE | Freq: Every day | ORAL | 4 refills | Status: AC

## 2022-05-13 MED ORDER — DEXLANSOPRAZOLE 60 MG PO CPDR: 60.0000 mg | DELAYED_RELEASE_CAPSULE | Freq: Every day | ORAL | 4 refills | Status: AC

## 2022-05-13 NOTE — Telephone Encounter (Signed)
Done. Damaris Schooner to patient. She wanted for the [facility] in [place] 90 day supply said that she pays for her prescriptions

## 2022-05-13 NOTE — Telephone Encounter (Signed)
Inbound call from patient stating that she needed to cancel her appointment with Dr. Lyndel Safe on 2/23 due to finding out bad news about her husband and just has a lot going on. Patient is wanting to see if she can get a refill on her [place] and Dexilant. Please advise.

## 2022-05-16 ENCOUNTER — Ambulatory Visit: Payer: BC Managed Care – PPO | Admitting: Gastroenterology

## 2022-06-05 DIAGNOSIS — R5383 Other fatigue: Secondary | ICD-10-CM | POA: Diagnosis not present

## 2022-06-05 DIAGNOSIS — D510 Vitamin B12 deficiency anemia due to intrinsic factor deficiency: Secondary | ICD-10-CM | POA: Diagnosis not present

## 2022-06-05 DIAGNOSIS — Z6833 Body mass index (BMI) 33.0-33.9, adult: Secondary | ICD-10-CM | POA: Diagnosis not present

## 2022-06-05 DIAGNOSIS — G4700 Insomnia, unspecified: Secondary | ICD-10-CM | POA: Diagnosis not present

## 2022-06-06 ENCOUNTER — Telehealth: Payer: Self-pay

## 2022-06-06 NOTE — Telephone Encounter (Signed)
PA request received via CMM for Dexlansoprazole 60MG  dr capsules  PA has been submitted to [facility] and has been APPROVED from 06/06/2022-06/05/2023  Key: [id number]

## 2022-07-03 DIAGNOSIS — S30861A Insect bite (nonvenomous) of abdominal wall, initial encounter: Secondary | ICD-10-CM | POA: Diagnosis not present

## 2022-09-23 DIAGNOSIS — Z6834 Body mass index (BMI) 34.0-34.9, adult: Secondary | ICD-10-CM | POA: Diagnosis not present

## 2022-09-23 DIAGNOSIS — E669 Obesity, unspecified: Secondary | ICD-10-CM | POA: Diagnosis not present

## 2022-10-19 DIAGNOSIS — E669 Obesity, unspecified: Secondary | ICD-10-CM | POA: Diagnosis not present

## 2022-10-19 DIAGNOSIS — R051 Acute cough: Secondary | ICD-10-CM | POA: Diagnosis not present

## 2022-10-19 DIAGNOSIS — Z6832 Body mass index (BMI) 32.0-32.9, adult: Secondary | ICD-10-CM | POA: Diagnosis not present

## 2022-10-19 DIAGNOSIS — U071 COVID-19: Secondary | ICD-10-CM | POA: Diagnosis not present

## 2023-01-28 DIAGNOSIS — Z1231 Encounter for screening mammogram for malignant neoplasm of breast: Secondary | ICD-10-CM | POA: Diagnosis not present

## 2023-01-28 DIAGNOSIS — Z6831 Body mass index (BMI) 31.0-31.9, adult: Secondary | ICD-10-CM | POA: Diagnosis not present

## 2023-01-28 DIAGNOSIS — K581 Irritable bowel syndrome with constipation: Secondary | ICD-10-CM | POA: Diagnosis not present

## 2023-01-28 DIAGNOSIS — Z0000 Encounter for general adult medical examination without abnormal findings: Secondary | ICD-10-CM | POA: Diagnosis not present

## 2023-01-28 DIAGNOSIS — E669 Obesity, unspecified: Secondary | ICD-10-CM | POA: Diagnosis not present

## 2023-03-04 DIAGNOSIS — A084 Viral intestinal infection, unspecified: Secondary | ICD-10-CM | POA: Diagnosis not present

## 2023-03-04 DIAGNOSIS — Z6829 Body mass index (BMI) 29.0-29.9, adult: Secondary | ICD-10-CM | POA: Diagnosis not present

## 2023-03-07 DIAGNOSIS — B370 Candidal stomatitis: Secondary | ICD-10-CM | POA: Diagnosis not present

## 2023-03-07 DIAGNOSIS — Z6829 Body mass index (BMI) 29.0-29.9, adult: Secondary | ICD-10-CM | POA: Diagnosis not present

## 2023-03-07 DIAGNOSIS — E663 Overweight: Secondary | ICD-10-CM | POA: Diagnosis not present

## 2023-03-07 DIAGNOSIS — K121 Other forms of stomatitis: Secondary | ICD-10-CM | POA: Diagnosis not present

## 2023-03-11 ENCOUNTER — Telehealth: Payer: Self-pay | Admitting: Gastroenterology

## 2023-03-11 NOTE — Telephone Encounter (Signed)
Over the past week patient has been experiencing Norovirus symptoms; sweating, vomitting, severe diarrhea. Over the weekend she noticed her bowels were then very light and she is constipated. She has Linzess  but not sure if she should take it. She was treated for thrush and is having issues swallowing along with abdominal discomfort above the naval. Please advise.

## 2023-03-11 NOTE — Telephone Encounter (Signed)
Pt stated that starting on  Tuesday 03/02/2023  she started having nausea, diarrhea, sweats. Pt stated that those symptoms have resolved and now she is constipated, and having difficulty swallowing.  Pt was scheduled to see Willette Cluster on 03/12/2023 NP at 2:00. Pt made aware. Pt verbalized understanding with all questions answered.

## 2023-03-12 ENCOUNTER — Encounter: Payer: Self-pay | Admitting: Nurse Practitioner

## 2023-03-12 ENCOUNTER — Ambulatory Visit: Payer: Self-pay | Admitting: Nurse Practitioner

## 2023-03-12 VITALS — BP 122/76 | HR 73 | Ht 67.0 in | Wt 202.4 lb

## 2023-03-12 DIAGNOSIS — R1310 Dysphagia, unspecified: Secondary | ICD-10-CM

## 2023-03-12 DIAGNOSIS — R1319 Other dysphagia: Secondary | ICD-10-CM

## 2023-03-12 DIAGNOSIS — K581 Irritable bowel syndrome with constipation: Secondary | ICD-10-CM | POA: Diagnosis not present

## 2023-03-12 MED ORDER — FLUCONAZOLE 200 MG PO TABS: 200.0000 mg | ORAL_TABLET | Freq: Every day | ORAL | 0 refills | Status: AC

## 2023-03-12 NOTE — Patient Instructions (Addendum)
_______________________________________________________  If your blood pressure at your visit was 140/90 or greater, please contact your primary care physician to follow up on this.  _______________________________________________________  If you are age 49 or older, your body mass index should be between 23-30. Your Body mass index is 31.7 kg/m. If this is out of the aforementioned range listed, please consider follow up with your Primary Care Provider.  If you are age 23 or younger, your body mass index should be between 19-25. Your Body mass index is 31.7 kg/m. If this is out of the aformentioned range listed, please consider follow up with your Primary Care Provider.   ________________________________________________________  The [facility] providers would like to encourage you to use [facility] to communicate with providers for non-urgent requests or questions.  Due to long hold times on the telephone, sending your provider a message by [facility] may be a faster and more efficient way to get a response.  Please allow 48 business hours for a response.  Please remember that this is for non-urgent requests.  _______________________________________________________  Today and tomorrow you can take a higher dose of Linzess. Following that you can resume you 145 mcg daily on empty stomach. Samples of 290 mcg given to you today.   Call in 7-10 days if swallowing problems not better and we will likely arrange for an EGD . In the meantime be sure to take swallowing precautions. Eat slowly, chew food well before swallowing. Drink  liquids in between each bite to avoid food impaction.  Please call us in 1 week to let us know how you are doing. If you are not better we will see about an EGD.  It was a pleasure to see you today!  Thank you for trusting me with your gastrointestinal care!

## 2023-03-12 NOTE — Progress Notes (Signed)
Agree with assessment/plan.  Raj Florestine Carmical, MD [facility] [phone]  

## 2023-03-12 NOTE — Progress Notes (Signed)
ASSESSMENT    Brief Narrative:  47 y.o.  female known to Dr. Chales Abrahams. She has a history of EoE / stricture , GERD, diverticulitis status post sigmoid resection,  IBS-C, cholecystecomy. Last seen Jan 2023 for IBS.   Recent gastroenteritis type illness with N/V and diarrhea, resolved.  IBS-C . Recurrent constipation after recent diarrhea illness. Minimal results after resumption of Linzess 145 mcg daily for last 3 days.  Acute dysphagia and odynophagia in the setting of recent thrush.  May have candida esophagitis.     See PMH for any additional medical & surgical history   PLAN   --Sample of Linzess to 290 mcg given.  She can take 290 mcg today and if no results take another dose in am.  Hopefully bowel movements will return to normal and she can resume her Linzess 145 mcg daily  -- Thrush resolved after 2 doses of Diflucan but has persistent odynophagia and dysphagia.  Will treat for Candida esophagitis.  Trial of Diflucan 200 mg daily x 10 days.   --Contact us in a week.  If absolutely no improvement then to proceed to EGD.    HPI   Chief complaint : recent diarrheal illness but now constipated again. Having problems swallowing again as well  Melasia developed acute nausea, vomiting and diarrhea on 12/9.  Saw PCP, told she likely had Norovirus. Stool studies not done. No one around her had similar symptoms. Symptoms  resolved witint a few days after which time she developed thrush ( had not taken any antibiotics). Went to UC, given 2 doses of diflucan . Uses Pulmicort about once / week but rinses mouth afterwards. Thrush resolved but she has odynphagia and dysphagia since thrush.   Not having any GERD on daily Dexilant daily. After resolution of diarrheal illness she restarted Linzess on Monday ( 3 days ago). Not yet had a normal.  In last three days has only had 2 small BMs. Hasn't tried Miralax because is makes her nauseated.    GI History / Pertinent GI Studies   **All  endoscopic studies may not be included here    December 2022 EGD - Esophageal mucosal changes secondary to eosinophilic esophagitis. Biopsied. Dilated. - LA Grade A reflux esophagitis with no bleeding. Biopsied. - Small hiatal hernia. - Gastritis. Biopsied. - A single gastric polyp. Resected and retrieved. - Erythematous duodenopathy. Biopsied.  Diagnosis 1. Surgical [P], random duodenum - DUODENAL MUCOSA WITH INTACT VILLOUS ARCHITECTURE. - NO VILLOUS ATROPHY OR INCREASED INTRAEPITHELIAL LYMPHOCYTES. 2. Surgical [P], gastric antrum and gastric body - UNREMARKABLE ANTRAL AND OXYNTIC MUCOSA. [place] NEGATIVE FOR HELICOBACTER PYLORI. 3. Surgical [P], gastric body polyp, polyp (1) - FUNDIC GLAND POLYP. - SMALL FRAGMENTS OF BENIGN SQUAMOUS MUCOSA. 4. Surgical [P], distal esophagus - SQUAMOUS MUCOSA WITH MILD REFLUX CHANGES AND FOCAL INTRAEPITHELIAL EOSINOPHILS. - FOCALLY UP TO 11 PER HIGH POWER FIELD. 5. Surgical [P], mid esophagus and proximal esophagus - UNREMARKABLE SQUAMOUS MUCOSA. - NO EOSINOPHILIC ESOPHAGITIS (LESS THAN 5 PER HIGH POWER FIELD).   Oct 2020 EGD - Esophageal mucosal changes secondary to eosinophilic esophagitis. Biopsied. Dilated. - Gastritis. Biopsied. - Duodenal erosions without bleeding  Surgical [P], gastric antrum and gastric body - GASTRIC ANTRAL MUCOSA WITH MILD NONSPECIFIC REACTIVE GASTROPATHY AND MILD CHRONIC GASTRITIS - GASTRIC OXYNTIC MUCOSA WITH NO SPECIFIC HISTOPATHOLOGIC CHANGES - WARTHIN STARRY STAIN IS NEGATIVE FOR HELICOBACTER PYLORI 2. Surgical [P], mid esophagus and distal esophagus - SQUAMOUS ESOPHAGEAL MUCOSA WITH MARKED REACTIVE CHANGES AND MODERATELY INCREASED INTRAEPITHELIAL  EOSINOPHILS (UP TO 40/HIGH POWER FIELD) CONSISTENT WITH EOSINOPHILIC ESOPHAGITIS IN THE APPROPRIATE CLINICAL SETTING - SCANT CARDIAC MUCOSA WITH NO SPECIFIC HISTOPATHOLOGIC CHANGES - PAS/F STAIN IS NEGATIVE FOR FUNGAL ORGANISMS  Colonoscopy by Dr. Georgiana Shore on  01/21/2018-  neg. recommend repeating at the age 18. (report scanned).  Patient with H/O diverticulitis s/p sigmoid resection 10/2014    Past Medical History:  Diagnosis Date   Acute bacterial bronchitis    Diverticulitis    Dysphagia    Eosinophilic esophagitis    GERD (gastroesophageal reflux disease)    Hyperlipidemia    no meds   Multiple gastric ulcers     Past Surgical History:  Procedure Laterality Date   APPENDECTOMY  08/21/2017   CHOLECYSTECTOMY     COLON RESECTION  10/2014   COLONOSCOPY  08/21/2014   Colonic poylp status post polypectomy. Mild left colonic diverticulosis.    COLONOSCOPY  01/21/2018   Dr Georgiana Shore in [place]   ESOPHAGOGASTRODUODENOSCOPY  05/26/2017   Linear and circular furrows of the esophagus highly suggestive of eosinophilic esophagitis (biopsied). Mild esophageal stricture status post esophageal dilatation. Multiple gastric ulcers (biopsied)   LAPAROSCOPIC HYSTERECTOMY  08/20/2017   left ovaries    TONSILLECTOMY     UPPER GASTROINTESTINAL ENDOSCOPY      Family History  Problem Relation Age of Onset   Diabetes Mother    High blood pressure Mother    High blood pressure Father    Asthma Sister    Other Sister        EOE   Breast cancer Maternal Aunt    Cancer - Other Maternal Aunt        Oral cancer   Kidney cancer Maternal Uncle    Lung cancer Paternal Grandfather    Colon cancer Neg Hx    Esophageal cancer Neg Hx    Stomach cancer Neg Hx    Rectal cancer Neg Hx    Colon polyps Neg Hx     Current Medications, Allergies, Family History and Social History were reviewed in [employer] medical record.     Current Outpatient Medications  Medication Sig Dispense Refill   AUVI-Q 0.3 MG/0.3ML SOAJ injection Use as directed for life-threatening allergic reaction. 2 each 3   budesonide (PULMICORT) 0.5 MG/2ML nebulizer solution Mix 1 vial with 5-10 packs of Splenda and swallow mixture 2 times daily as directed 120 mL 5    Cholecalciferol (VITAMIN D3) 50 MCG (2000 UT) TABS Take 1 tablet by mouth daily.     dexlansoprazole (DEXILANT) 60 MG capsule Take 1 capsule (60 mg total) by mouth daily. 90 capsule 4   dicyclomine (BENTYL) 20 MG tablet Take 1 tablet (20 mg total) by mouth every 6 (six) hours as needed for spasms. 30 tablet 0   docusate sodium (COLACE) 100 MG capsule Take 100 mg by mouth daily.     fluconazole (DIFLUCAN) 200 MG tablet Take 1 tablet (200 mg total) by mouth daily. For 10 days 10 tablet 0   glucosamine-chondroitin 500-400 MG tablet Take 1 tablet by mouth at bedtime.     linaclotide (LINZESS) 145 MCG CAPS capsule Take 1 capsule (145 mcg total) by mouth daily before breakfast. 90 capsule 4   ondansetron (ZOFRAN) 4 MG tablet Take 1 tablet (4 mg total) by mouth every 8 (eight) hours as needed for nausea or vomiting. 20 tablet 0   phentermine (ADIPEX-P) 37.5 MG tablet Take 37.5 mg by mouth every morning.     Turmeric (QC TUMERIC COMPLEX PO)  Take 1 tablet by mouth daily.     cyanocobalamin (,VITAMIN B-12,) 1000 MCG/ML injection Inject 1,000 mcg into the muscle every 30 (thirty) days.  (Patient not taking: Reported on 03/12/2023)     Current Facility-Administered Medications  Medication Dose Route Frequency Provider Last Rate Last Admin   0.9 %  sodium chloride infusion  500 mL Intravenous Once Lynann Bologna, MD        Review of Systems: No chest pain. No shortness of breath. No urinary complaints.    Physical Exam  There were no vitals filed for this visit. Wt Readings from Last 3 Encounters:  04/16/21 220 lb (99.8 kg)  03/07/21 212 lb (96.2 kg)  01/28/21 212 lb (96.2 kg)    BP 122/76   Pulse 73   Ht 5\' 7"  (1.702 m)   Wt 202 lb 6 oz (91.8 kg)   SpO2 99%   BMI 31.70 kg/m  Constitutional:  Pleasant, generally well appearing female in no acute distress. Psychiatric: Normal mood and affect. Behavior is normal. EENT: Pupils normal.  Conjunctivae are normal. No scleral icterus. Neck supple.   Cardiovascular: Normal rate, regular rhythm.  Pulmonary/chest: Effort normal and breath sounds normal. No wheezing, rales or rhonchi. Abdominal: Soft, nondistended, nontender. Bowel sounds active throughout. There are no masses palpable. No hepatomegaly. Neurological: Alert and oriented to person place and time.    Willette Cluster, NP  03/12/2023, 2:05 PM

## 2023-03-19 NOTE — Telephone Encounter (Signed)
Called patient and she states she is no longer having vomiting, and abdominal pain. Patient states the Linzess is working and she believes that's why she isn't having abdominal pain. She also states she is no longer constipated and wants to schedule for the EGD procedure, although she still has Thrush.

## 2023-03-19 NOTE — Telephone Encounter (Signed)
Patient called stating she is not feeling any better.

## 2023-03-19 NOTE — Telephone Encounter (Signed)
Patient is calling stating her symptoms are the same and are not getting any better.

## 2023-03-19 NOTE — Telephone Encounter (Signed)
Notified the patient that she would have to stop taking the Phentermine medication for 10 days prior to the procedure. Patient informed the nurse that she has not taken the medication for about 3 weeks now and had informed Gunnar Fusi of this. No noted availability at this time. Patient informed that Dr. Urban Gibson schedule shows no availability at present and the schedule for March has not posted as of yet. Encouraged the patient to call and check on availability and informed her she will be placed on the waiting list.

## 2023-03-19 NOTE — Telephone Encounter (Addendum)
If patient is still having trouble swallowing or painful swallowing then schedule EGd with Dr. Chales Abrahams  Dx dysphagia and odynophagia  She is on phentermine and that needs to be held prior to an EGd for 10 days (double check me on that)

## 2023-03-19 NOTE — Telephone Encounter (Signed)
Called patient and she is still having issues with the Thrush since her last visit with Midge Minium. Patient states the Diflucan medication seems to make no difference and she wants to schedule for the EGD today. We have 1 slot available at this time. Can I have the order to schedule for a EGD for this patient?

## 2023-03-23 NOTE — Telephone Encounter (Signed)
Called patient and scheduled EGD appt with Dr. Doy Hutching. Patient stated again, that she is not presently taking the Adipex medication for weight loss. Patient was informed to come to the 4th floor for the procedure dated 04/01/22 at 0730a.

## 2023-04-02 ENCOUNTER — Encounter: Payer: Self-pay | Admitting: Pediatrics

## 2023-04-02 ENCOUNTER — Ambulatory Visit (AMBULATORY_SURGERY_CENTER): Payer: BC Managed Care – PPO | Admitting: Pediatrics

## 2023-04-02 VITALS — BP 127/74 | HR 74 | Temp 97.3°F | Resp 11 | Ht 67.0 in | Wt 202.0 lb

## 2023-04-02 DIAGNOSIS — K2289 Other specified disease of esophagus: Secondary | ICD-10-CM | POA: Diagnosis not present

## 2023-04-02 DIAGNOSIS — R1310 Dysphagia, unspecified: Secondary | ICD-10-CM | POA: Diagnosis not present

## 2023-04-02 DIAGNOSIS — K200 Eosinophilic esophagitis: Secondary | ICD-10-CM | POA: Diagnosis not present

## 2023-04-02 MED ORDER — SODIUM CHLORIDE 0.9 % IV SOLN: 500.0000 mL | Freq: Once | INTRAVENOUS | Status: AC

## 2023-04-02 NOTE — Op Note (Addendum)
 [facility] Patient Name: Victoria Cruz Procedure Date: 04/02/2023 8:08 AM MRN: [id number] Endoscopist: Inocente Hausen , MD,  Age: 48 Referring MD:  Date of Birth: 09-Aug-1975 Gender: Female Account #: [id number] Procedure:                Upper GI endoscopy Indications:              Odynophagia, Follow-up of eosinophilic esophagitis,                            Recent thrush s/p treatment with Diflucan  with                            ongoing odynophagia - R/O Candida esophagitis.                            Patient denies other active symptoms of EoE at this                            time. Medicines:                Monitored Anesthesia Care Procedure:                Pre-Anesthesia Assessment:                           - Prior to the procedure, a History and Physical                            was performed, and patient medications and                            allergies were reviewed. The patient's tolerance of                            previous anesthesia was also reviewed. The risks                            and benefits of the procedure and the sedation                            options and risks were discussed with the patient.                            All questions were answered, and informed consent                            was obtained. Prior Anticoagulants: The patient has                            taken no anticoagulant or antiplatelet agents. ASA                            Grade Assessment: II - A patient with mild systemic  disease. After reviewing the risks and benefits,                            the patient was deemed in satisfactory condition to                            undergo the procedure.                           After obtaining informed consent, the endoscope was                            passed under direct vision. Throughout the                            procedure, the patient's blood pressure, pulse, and                             oxygen saturations were monitored continuously. The                            Olympus scope [id number] was introduced through the                            mouth, and advanced to the second part of duodenum. Scope In: Scope Out: Findings:                 The examined esophagus was normal. No Candida seen.                            Biopsies were obtained from the proximal and distal                            esophagus with cold forceps for histology of                            suspected eosinophilic esophagitis.                           The gastric body, gastric antrum, cardia (on                            retroflexion) and gastric fundus (on retroflexion)                            were normal.                           The duodenal bulb and second portion of the                            duodenum were normal. Complications:            No immediate complications. Estimated blood loss:  Minimal. Estimated Blood Loss:     Estimated blood loss was minimal. Impression:               - Normal esophagus. Biopsies performed to evaluate                            for EoE. No findings to explain odynophagia.                           - Normal gastric body, antrum, cardia and gastric                            fundus.                           - Normal duodenal bulb and second portion of the                            duodenum.                           - Biopsies were taken with a cold forceps for                            evaluation of eosinophilic esophagitis. Recommendation:           - Discharge patient to home (ambulatory).                           - Await pathology results.                           - The findings and recommendations were discussed                            with the patient's family.                           - Return to GI office as previously scheduled.                           - Patient has a contact number available for                             emergencies. The signs and symptoms of potential                            delayed complications were discussed with the                            patient. Return to normal activities tomorrow.                            Written discharge instructions were provided to the  patient. Inocente Hausen, MD 04/02/2023 8:44:08 AM

## 2023-04-02 NOTE — Progress Notes (Signed)
 Report to PACU, RN, vss, BBS= Clear.

## 2023-04-02 NOTE — Progress Notes (Signed)
 [facility] History and Physical   Primary Care Physician:  Dottie Norleen PHEBE PONCE, MD   Reason for Procedure:  History of EOE and dysphagia with need for esophageal dilation in the past.  More recently complaining of odynophagia without dysphagia.  Had thrush but odynophagia has not resolved after treatment.  EGD is performed to evaluate for Candida  Plan:    Upper endoscopy     HPI: Adriona Kaney is a 48 y.o. female with history of EOE and dysphagia with need for esophageal dilation in the past.  More recently complaining of odynophagia without dysphagia.  Had thrush but odynophagia has not resolved after treatment.  EGD is performed to evaluate    Past Medical History:  Diagnosis Date   Acute bacterial bronchitis    Diverticulitis    Dysphagia    Eosinophilic esophagitis    GERD (gastroesophageal reflux disease)    Hyperlipidemia    no meds   Multiple gastric ulcers     Past Surgical History:  Procedure Laterality Date   APPENDECTOMY  08/21/2017   CHOLECYSTECTOMY     COLON RESECTION  10/2014   COLONOSCOPY  08/21/2014   Colonic poylp status post polypectomy. Mild left colonic diverticulosis.    COLONOSCOPY  01/21/2018   Dr Bert in [place]   ESOPHAGOGASTRODUODENOSCOPY  05/26/2017   Linear and circular furrows of the esophagus highly suggestive of eosinophilic esophagitis (biopsied). Mild esophageal stricture status post esophageal dilatation. Multiple gastric ulcers (biopsied)   LAPAROSCOPIC HYSTERECTOMY  08/20/2017   left ovaries    TONSILLECTOMY     UPPER GASTROINTESTINAL ENDOSCOPY      Prior to Admission medications   Medication Sig Start Date End Date Taking? Authorizing Provider  budesonide  (PULMICORT ) 0.5 MG/2ML nebulizer solution Mix 1 vial with 5-10 packs of Splenda and swallow mixture 2 times daily as directed 12/17/20  Yes Kozlow, Eric J, MD  Cholecalciferol (VITAMIN D3) 50 MCG (2000 UT) TABS Take 1 tablet by mouth daily.   Yes [provider]  dexlansoprazole  (DEXILANT ) 60 MG capsule Take 1 capsule (60 mg total) by mouth daily. 05/13/22  Yes Charlanne Groom, MD  dicyclomine  (BENTYL ) 20 MG tablet Take 1 tablet (20 mg total) by mouth every 6 (six) hours as needed for spasms. 03/26/21  Yes Avram Lupita BRAVO, MD  linaclotide  (LINZESS ) 145 MCG CAPS capsule Take 1 capsule (145 mcg total) by mouth daily before breakfast. 05/13/22  Yes Charlanne Groom, MD  AUVI-Q  0.3 MG/0.3ML SOAJ injection Use as directed for life-threatening allergic reaction. 12/17/20   Kozlow, Camellia PARAS, MD  cyanocobalamin  (,VITAMIN B-12,) 1000 MCG/ML injection Inject 1,000 mcg into the muscle every 30 (thirty) days.  Patient not taking: Reported on 04/02/2023 05/04/17   [provider]  docusate sodium (COLACE) 100 MG capsule Take 100 mg by mouth daily. Patient not taking: Reported on 04/02/2023    [provider]  fluconazole  (DIFLUCAN ) 200 MG tablet Take 1 tablet (200 mg total) by mouth daily. For 10 days 03/12/23   Kerman Vina HERO, NP  glucosamine-chondroitin 500-400 MG tablet Take 1 tablet by mouth at bedtime. Patient not taking: Reported on 04/02/2023    [provider]  ondansetron  (ZOFRAN ) 4 MG tablet Take 1 tablet (4 mg total) by mouth every 8 (eight) hours as needed for nausea or vomiting. 01/28/21   Charlanne Groom, MD  phentermine (ADIPEX-P) 37.5 MG tablet Take 37.5 mg by mouth every morning. Patient not taking: Reported on 04/02/2023 01/26/23   [provider]  Turmeric (  QC TUMERIC COMPLEX PO) Take 1 tablet by mouth daily.    [provider]    Current Outpatient Medications  Medication Sig Dispense Refill   budesonide  (PULMICORT ) 0.5 MG/2ML nebulizer solution Mix 1 vial with 5-10 packs of Splenda and swallow mixture 2 times daily as directed 120 mL 5   Cholecalciferol (VITAMIN D3) 50 MCG (2000 UT) TABS Take 1 tablet by mouth daily.     dexlansoprazole  (DEXILANT ) 60 MG capsule Take 1 capsule (60 mg total) by mouth daily.  90 capsule 4   dicyclomine  (BENTYL ) 20 MG tablet Take 1 tablet (20 mg total) by mouth every 6 (six) hours as needed for spasms. 30 tablet 0   linaclotide  (LINZESS ) 145 MCG CAPS capsule Take 1 capsule (145 mcg total) by mouth daily before breakfast. 90 capsule 4   AUVI-Q  0.3 MG/0.3ML SOAJ injection Use as directed for life-threatening allergic reaction. 2 each 3   cyanocobalamin  (,VITAMIN B-12,) 1000 MCG/ML injection Inject 1,000 mcg into the muscle every 30 (thirty) days.  (Patient not taking: Reported on 04/02/2023)     docusate sodium (COLACE) 100 MG capsule Take 100 mg by mouth daily. (Patient not taking: Reported on 04/02/2023)     fluconazole  (DIFLUCAN ) 200 MG tablet Take 1 tablet (200 mg total) by mouth daily. For 10 days 10 tablet 0   glucosamine-chondroitin 500-400 MG tablet Take 1 tablet by mouth at bedtime. (Patient not taking: Reported on 04/02/2023)     ondansetron  (ZOFRAN ) 4 MG tablet Take 1 tablet (4 mg total) by mouth every 8 (eight) hours as needed for nausea or vomiting. 20 tablet 0   phentermine (ADIPEX-P) 37.5 MG tablet Take 37.5 mg by mouth every morning. (Patient not taking: Reported on 04/02/2023)     Turmeric (QC TUMERIC COMPLEX PO) Take 1 tablet by mouth daily.     Current Facility-Administered Medications  Medication Dose Route Frequency Provider Last Rate Last Admin   0.9 %  sodium chloride  infusion  500 mL Intravenous Once Charlanne Groom, MD       0.9 %  sodium chloride  infusion  500 mL Intravenous Once Suzann Inocente HERO, MD        Allergies as of 04/02/2023 - Review Complete 04/02/2023  Allergen Reaction Noted   Peanut-containing drug products Other (See Comments) 11/17/2018   [place] isothiocyanate] Other (See Comments) 11/17/2018   Onion Other (See Comments) 11/17/2018    Family History  Problem Relation Age of Onset   Diabetes Mother    High blood pressure Mother    High blood pressure Father    Asthma Sister    Other Sister        EOE   Breast cancer  Maternal Aunt    Cancer - Other Maternal Aunt        Oral cancer   Kidney cancer Maternal Uncle    Lung cancer Paternal Grandfather    Colon cancer Neg Hx    Esophageal cancer Neg Hx    Stomach cancer Neg Hx    Rectal cancer Neg Hx    Colon polyps Neg Hx     Social History   Socioeconomic History   Marital status: Married    Spouse name: Not on file   Number of children: 2   Years of education: Not on file   Highest education level: Not on file  Occupational History   Occupation: Nurse  Tobacco Use   Smoking status: Never   Smokeless tobacco: Never  Vaping Use   Vaping  status: Never Used  Substance and Sexual Activity   Alcohol use: Not Currently   Drug use: Never   Sexual activity: Not on file  Other Topics Concern   Not on file  Social History Narrative   Not on file   Social Drivers of Health   Financial Resource Strain: Not on file  Food Insecurity: Not on file  Transportation Needs: Not on file  Physical Activity: Not on file  Stress: Not on file  Social Connections: Unknown (08/06/2021)   Received from [facility]   Social Network    Social Network: Not on file  Intimate Partner Violence: Unknown (06/28/2021)   Received from [facility], [facility]   HITS    Physically Hurt: Not on file    Insult or Talk Down To: Not on file    Threaten Physical Harm: Not on file    Scream or Curse: Not on file    Review of Systems:  All other review of systems negative except as mentioned in the HPI.  Physical Exam: Vital signs BP (!) 147/96   Pulse 68   Temp (!) 97.3 F (36.3 C)   Resp 13   Ht 5' 7 (1.702 m)   Wt 202 lb (91.6 kg)   SpO2 97%   BMI 31.64 kg/m   General:   Alert,  Well-developed, well-nourished, pleasant and cooperative in NAD Airway:  Mallampati 3 Lungs:  Clear throughout to auscultation.   Heart:  Regular rate and rhythm; no murmurs, clicks, rubs,  or gallops. Abdomen:  Soft, nontender and nondistended. Normal  bowel sounds.   Neuro/Psych:  Normal mood and affect. A and O x 3  Inocente Hausen, MD [facility]

## 2023-04-02 NOTE — Progress Notes (Signed)
 Called to room to assist during endoscopic procedure.  Patient ID and intended procedure confirmed with present staff. Received instructions for my participation in the procedure from the performing physician.

## 2023-04-02 NOTE — Patient Instructions (Addendum)
 Recommendation:  - Discharge patient to home ( ambulatory) .  - Await pathology results.  - The findings and recommendations were discussed with the patient' s family.  - Return to GI office as previously scheduled. - Patient has a contact number available for emergencies. The signs and symptoms of potential delayed complications were discussed with the patient. Return to normal activities tomorrow. Written discharge instructions were provided to the patient.   YOU HAD AN ENDOSCOPIC PROCEDURE TODAY AT THE [facility]:   Refer to the procedure report that was given to you for any specific questions about what was found during the examination.  If the procedure report does not answer your questions, please call your gastroenterologist to clarify.  If you requested that your care partner not be given the details of your procedure findings, then the procedure report has been included in a sealed envelope for you to review at your convenience later.  YOU SHOULD EXPECT: Some feelings of bloating in the abdomen. Passage of more gas than usual.  Walking can help get rid of the air that was put into your GI tract during the procedure and reduce the bloating. If you had a lower endoscopy (such as a colonoscopy or flexible sigmoidoscopy) you may notice spotting of blood in your stool or on the toilet paper. If you underwent a bowel prep for your procedure, you may not have a normal bowel movement for a few days.  Please Note:  You might notice some irritation and congestion in your nose or some drainage.  This is from the oxygen used during your procedure.  There is no need for concern and it should clear up in a day or so.  SYMPTOMS TO REPORT IMMEDIATELY:  Following upper endoscopy (EGD)  Vomiting of blood or coffee ground material  New chest pain or pain under the shoulder blades  Painful or persistently difficult swallowing  New shortness of breath  Fever of 100F or higher  Black,  tarry-looking stools  For urgent or emergent issues, a gastroenterologist can be reached at any hour by calling [phone]. Do not use MyChart messaging for urgent concerns.    DIET:  We do recommend a small meal at first, but then you may proceed to your regular diet.  Drink plenty of fluids but you should avoid alcoholic beverages for 24 hours.  MEDICATIONS: Continue present medications.  FOLLOW UP: Await pathology results. Return to GI office as previously scheduled.  Thank you for allowing us  to provide for your healthcare needs today.  ACTIVITY:  You should plan to take it easy for the rest of today and you should NOT DRIVE or use heavy machinery until tomorrow (because of the sedation medicines used during the test).    FOLLOW UP: Our staff will call the number listed on your records the next business day following your procedure.  We will call around 7:15- 8:00 am to check on you and address any questions or concerns that you may have regarding the information given to you following your procedure. If we do not reach you, we will leave a message.     If any biopsies were taken you will be contacted by phone or by letter within the next 1-3 weeks.  Please call us  at [phone] if you have not heard about the biopsies in 3 weeks.    SIGNATURES/CONFIDENTIALITY: You and/or your care partner have signed paperwork which will be entered into your electronic medical record.  These signatures attest  to the fact that that the information above on your After Visit Summary has been reviewed and is understood.  Full responsibility of the confidentiality of this discharge information lies with you and/or your care-partner.

## 2023-04-03 ENCOUNTER — Telehealth: Payer: Self-pay

## 2023-04-03 NOTE — Telephone Encounter (Signed)
  Follow up Call-     04/02/2023    7:47 AM 03/07/2021    9:09 AM  Call back number  Post procedure Call Back phone  # [phone]  Permission to leave phone message Yes Yes     Patient questions:  Do you have a fever, pain , or abdominal swelling? No. Pain Score  0 *  Have you tolerated food without any problems? Yes.    Have you been able to return to your normal activities? Yes.    Do you have any questions about your discharge instructions: Diet   No. Medications  No. Follow up visit  No.  Do you have questions or concerns about your Care? No.  Actions: * If pain score is 4 or above: No action needed, pain <4.

## 2023-04-06 ENCOUNTER — Encounter: Payer: Self-pay | Admitting: Pediatrics

## 2023-04-06 LAB — SURGICAL PATHOLOGY

## 2023-04-07 NOTE — Progress Notes (Signed)
 Thanks Dr Netta Neat

## 2023-04-30 DIAGNOSIS — I10 Essential (primary) hypertension: Secondary | ICD-10-CM | POA: Diagnosis not present

## 2023-04-30 DIAGNOSIS — Z08 Encounter for follow-up examination after completed treatment for malignant neoplasm: Secondary | ICD-10-CM | POA: Diagnosis not present

## 2023-04-30 DIAGNOSIS — Z85038 Personal history of other malignant neoplasm of large intestine: Secondary | ICD-10-CM | POA: Diagnosis not present

## 2023-04-30 DIAGNOSIS — C787 Secondary malignant neoplasm of liver and intrahepatic bile duct: Secondary | ICD-10-CM | POA: Diagnosis not present

## 2023-04-30 DIAGNOSIS — G629 Polyneuropathy, unspecified: Secondary | ICD-10-CM | POA: Diagnosis not present

## 2023-08-05 DIAGNOSIS — R051 Acute cough: Secondary | ICD-10-CM | POA: Diagnosis not present

## 2023-08-05 DIAGNOSIS — R0981 Nasal congestion: Secondary | ICD-10-CM | POA: Diagnosis not present

## 2023-08-05 DIAGNOSIS — H9203 Otalgia, bilateral: Secondary | ICD-10-CM | POA: Diagnosis not present

## 2023-09-09 DIAGNOSIS — E538 Deficiency of other specified B group vitamins: Secondary | ICD-10-CM | POA: Diagnosis not present

## 2023-09-09 DIAGNOSIS — R252 Cramp and spasm: Secondary | ICD-10-CM | POA: Diagnosis not present

## 2023-09-09 DIAGNOSIS — I10 Essential (primary) hypertension: Secondary | ICD-10-CM | POA: Diagnosis not present

## 2023-09-09 DIAGNOSIS — Z131 Encounter for screening for diabetes mellitus: Secondary | ICD-10-CM | POA: Diagnosis not present

## 2023-10-01 DIAGNOSIS — E663 Overweight: Secondary | ICD-10-CM | POA: Diagnosis not present

## 2023-10-01 DIAGNOSIS — Z6833 Body mass index (BMI) 33.0-33.9, adult: Secondary | ICD-10-CM | POA: Diagnosis not present

## 2023-10-01 DIAGNOSIS — I10 Essential (primary) hypertension: Secondary | ICD-10-CM | POA: Diagnosis not present

## 2023-11-04 DIAGNOSIS — I10 Essential (primary) hypertension: Secondary | ICD-10-CM | POA: Diagnosis not present

## 2023-11-04 DIAGNOSIS — Z6833 Body mass index (BMI) 33.0-33.9, adult: Secondary | ICD-10-CM | POA: Diagnosis not present

## 2024-01-06 DIAGNOSIS — Z6833 Body mass index (BMI) 33.0-33.9, adult: Secondary | ICD-10-CM | POA: Diagnosis not present

## 2024-01-06 DIAGNOSIS — L089 Local infection of the skin and subcutaneous tissue, unspecified: Secondary | ICD-10-CM | POA: Diagnosis not present

## 2024-01-29 DIAGNOSIS — I10 Essential (primary) hypertension: Secondary | ICD-10-CM | POA: Diagnosis not present

## 2024-01-29 DIAGNOSIS — Z0000 Encounter for general adult medical examination without abnormal findings: Secondary | ICD-10-CM | POA: Diagnosis not present

## 2024-01-29 DIAGNOSIS — E669 Obesity, unspecified: Secondary | ICD-10-CM | POA: Diagnosis not present

## 2024-01-29 DIAGNOSIS — Z131 Encounter for screening for diabetes mellitus: Secondary | ICD-10-CM | POA: Diagnosis not present

## 2024-01-29 DIAGNOSIS — Z13220 Encounter for screening for lipoid disorders: Secondary | ICD-10-CM | POA: Diagnosis not present

## 2024-02-01 DIAGNOSIS — R5383 Other fatigue: Secondary | ICD-10-CM | POA: Diagnosis not present

## 2024-02-01 DIAGNOSIS — D510 Vitamin B12 deficiency anemia due to intrinsic factor deficiency: Secondary | ICD-10-CM | POA: Diagnosis not present

## 2024-03-04 DIAGNOSIS — L02821 Furuncle of head [any part, except face]: Secondary | ICD-10-CM | POA: Diagnosis not present

## 2024-03-04 DIAGNOSIS — Z6832 Body mass index (BMI) 32.0-32.9, adult: Secondary | ICD-10-CM | POA: Diagnosis not present

## 2024-03-15 DIAGNOSIS — Z1231 Encounter for screening mammogram for malignant neoplasm of breast: Secondary | ICD-10-CM | POA: Diagnosis not present
